# Patient Record
Sex: Male | Born: 1940 | Race: White | Hispanic: No | Marital: Married
Health system: Midwestern US, Community
[De-identification: ages and names within clinical notes are randomized; demographics above are authoritative.]

## PROBLEM LIST (undated history)

## (undated) DIAGNOSIS — E538 Deficiency of other specified B group vitamins: Secondary | ICD-10-CM

## (undated) DIAGNOSIS — Z79899 Other long term (current) drug therapy: Secondary | ICD-10-CM

## (undated) DIAGNOSIS — E119 Type 2 diabetes mellitus without complications: Principal | ICD-10-CM

## (undated) DIAGNOSIS — R42 Dizziness and giddiness: Secondary | ICD-10-CM

## (undated) DIAGNOSIS — E7800 Pure hypercholesterolemia, unspecified: Secondary | ICD-10-CM

## (undated) DIAGNOSIS — H8309 Labyrinthitis, unspecified ear: Secondary | ICD-10-CM

## (undated) DIAGNOSIS — M48062 Spinal stenosis, lumbar region with neurogenic claudication: Secondary | ICD-10-CM

---

## 2014-03-18 MED ORDER — GLIMEPIRIDE 4 MG PO TABS
4 | ORAL_TABLET | Freq: Two times a day (BID) | ORAL | Status: DC
Start: 2014-03-18 — End: 2014-07-14

## 2014-03-18 MED ORDER — MECLIZINE HCL 25 MG PO TABS
25 | ORAL_TABLET | Freq: Three times a day (TID) | ORAL | Status: AC | PRN
Start: 2014-03-18 — End: 2014-03-28

## 2014-03-18 MED ORDER — MECLIZINE HCL 25 MG PO TABS
25 | ORAL_TABLET | Freq: Three times a day (TID) | ORAL | Status: DC | PRN
Start: 2014-03-18 — End: 2014-03-18

## 2014-03-18 NOTE — Patient Instructions (Signed)
Labyrinthitis: After Your Visit  Your Care Instructions     Labyrinthitis (say "lab-uh-rin-THY-tus") is a problem deep inside your inner ear. It happens when the labyrinth gets inflamed. That's the part of your inner ear that helps control your balance.  The problem may cause vertigo. Vertigo makes you feel like you're spinning or whirling. You may feel sick to your stomach or vomit. You may lose your hearing for a while. Or you may have a ringing sound in your ears.  Most of the time, labyrinthitis goes away on its own. This often takes several weeks.  If the problem is caused by bacteria, your doctor will give you antibiotics. But most cases are caused by a virus. A virus can't be cured with antibiotics.  Your doctor may give you medicines to help control the nausea and vomiting.  Follow-up care is a key part of your treatment and safety. Be sure to make and go to all appointments, and call your doctor if you are having problems. It's also a good idea to know your test results and keep a list of the medicines you take.  How can you care for yourself at home?   Try bed rest and keeping your head still for the first few days you have vertigo. This may help the vertigo and reduce nausea and vomiting.   Return to your normal activities if vertigo lasts more than a few days. This may be hard, but it usually helps your brain adapt to the vertigo more quickly. As your brain adapts, vertigo will slowly go away.   Do what you can to prevent falls. For example, keep your home uncluttered, and use nonskid mats around your house and in your bath. Vertigo makes you more likely to fall.   Try balance exercises for vertigo if your doctor suggests it. An example is to stand with your feet together, arms at your sides. Hold this position for 30 seconds.   Do the Brandt-Daroff exercise if your doctor suggests it. It may help your brain adapt to vertigo.   Sit on the edge of your bed or sofa.   Quickly lie down on one  side.   Stay in this position until the vertigo goes away or for at least 30 seconds.   Sit up. If this causes vertigo, wait for it to stop.   Do the exercise on the other side.   Repeat these steps 10 times. Do the exercise 2 times a day until the vertigo is gone.   Take your medicines exactly as prescribed. Call your doctor if you think you are having a problem with your medicine.   If your doctor prescribed antibiotics, take them as directed. Do not stop taking them just because you feel better. You need to take the full course of antibiotics.  When should you call for help?  Call 911 anytime you think you may need emergency care. For example, call if:   You passed out (lost consciousness).   You have signs of a stroke. These may include:   Sudden numbness, paralysis, or weakness in your face, arm, or leg, especially on only one side of your body.   New problems with walking or balance.   Drooling or slurred speech.   New problems speaking or understanding simple statements, or feeling confused.   A sudden, severe headache that is different from past headaches.  Call your doctor now or seek immediate medical care if:   You have new or increased nausea   or vomiting.  Watch closely for changes in your health, and be sure to contact your doctor if:   Your vertigo gets worse.   Your vertigo has not gotten better in 2 weeks.   Where can you learn more?   Go to [email or web address] and sign in to your MyChart account. Enter U094 in the Search Health Information box to learn more about "Labyrinthitis: After Your Visit."    If you do not have an account, please click on the "Sign Up Now" link.      2006-2015 [employer], [employer]. Care instructions adapted under license by [facility]. This care instruction is for use with your licensed healthcare professional. If you have questions about a medical condition or this instruction, always ask your healthcare professional. Healthwise,  Incorporated disclaims any warranty or liability for your use of this information.  Content Version: 10.4.390249; Current as of: August 07, 2013

## 2014-03-18 NOTE — Progress Notes (Signed)
Subjective:      Patient ID: Andrew [name] is a 73 y.o. male.    HPI    Review of Systems    Objective:   Physical Exam   Constitutional: He is oriented to person, place, and time. He appears well-developed and well-nourished.   HENT:   Head: Normocephalic.   Eyes: Pupils are equal, round, and reactive to light. No scleral icterus.   Neck: Normal range of motion. Neck supple. No thyromegaly present.   Cardiovascular: Normal rate, regular rhythm, normal heart sounds and intact distal pulses.    Pulmonary/Chest: Breath sounds normal.   Neurological: He is alert and oriented to person, place, and time. He displays normal reflexes. No cranial nerve deficit. Coordination normal.       Assessment:         Plan:             Subjective:       Andrew Flakernest D Santillo is a 73 y.o. male who presents for evaluation dizziness. The symptoms started 2 months ago and are gradually worsening.  The attacks occur every 2 days and last 1 minutes. Positions that worsen symptoms: bending over, lying down and standing up. Previous workup/treatments: none. Associated ear symptoms: otalgia in the right ear which is ongoing. Associated CNS symptoms: none. Recent infections: none. Head trauma: denied.  Patient's medications, allergies, past medical, surgical, social and family histories were reviewed and updated as appropriate.    Review of Systems  Pertinent items are noted in HPI.      Objective:      BP 163/87   Pulse 67   Ht 5\' 10"  (1.778 m)   Wt 199 lb 12.8 oz (90.629 kg)   BMI 28.67 kg/m2         Physical Exam   Constitutional: He is oriented to person, place, and time. He appears well-developed and well-nourished.   HENT:   Head: Normocephalic.   Eyes: Pupils are equal, round, and reactive to light. No scleral icterus.   Neck: Normal range of motion. Neck supple. No thyromegaly present.   Cardiovascular: Normal rate, regular rhythm, normal heart sounds and intact distal pulses.    Pulmonary/Chest: Breath sounds normal.   Neurological: He  is alert and oriented to person, place, and time. He displays normal reflexes. No cranial nerve deficit. Coordination normal.       Assessment:      Acute labyrinthitis      Plan:      Meclizine per med orders.

## 2014-04-07 ENCOUNTER — Telehealth

## 2014-04-07 NOTE — Telephone Encounter (Signed)
Pt is still having dizziness after using meclizine. Is seeing the ENT the next step or something else?

## 2014-04-07 NOTE — Telephone Encounter (Signed)
Patient cd stating he saw dr Jean RosenthalJackson and she put him on meds. He is still experiencing dizziness. He would like to be referred to ENT. Please call him at [phone].

## 2014-04-08 NOTE — Telephone Encounter (Signed)
Pt advised of Dr. Edison PaceJackson's recommendation.  Pt will contact ENT.

## 2014-04-08 NOTE — Telephone Encounter (Signed)
Meds Dr Shela Commons (26 th of June)put him on not working. Do you want to switch Meds or Refer to an ENT? [phone]

## 2014-04-08 NOTE — Telephone Encounter (Signed)
See ENT, Dr Gentry Fitz in [place]

## 2014-04-13 MED ADMIN — cyanocobalamin injection 1,000 mcg: 1000 ug | INTRAMUSCULAR | @ 14:00:00 | NDC 63323004401

## 2014-04-19 ENCOUNTER — Telehealth

## 2014-04-19 NOTE — Telephone Encounter (Signed)
Saw Dr.Donald ENT and he found nothing wrong with him. He still is dizzy and wants to know next step. Please advise.

## 2014-04-20 NOTE — Telephone Encounter (Signed)
Gave pt. The info. Will need a PT order and referral  for him to go.

## 2014-04-20 NOTE — Telephone Encounter (Signed)
Please see [facility] to learn maneuvers to mobilize crystals in inner ear.  Would consider neuro consult if no better.

## 2014-04-21 NOTE — Telephone Encounter (Signed)
PT req up front for pt to pick up

## 2014-04-21 NOTE — Telephone Encounter (Signed)
Pt advised that PT referral is ready for pickup.  [facility]  Referral given to receptionist at [facility].

## 2014-05-14 MED ORDER — NEOMYCIN-POLYMYXIN-HC 3.5-10000-1 OT SOLN
Freq: Three times a day (TID) | OTIC | Status: DC
Start: 2014-05-14 — End: 2014-05-14

## 2014-05-14 MED ORDER — NEOMYCIN-POLYMYXIN-HC 3.5-10000-1 OT SOLN
Freq: Three times a day (TID) | OTIC | Status: AC
Start: 2014-05-14 — End: 2014-05-24

## 2014-05-14 MED ADMIN — cyanocobalamin injection 1,000 mcg: 1000 ug | INTRAMUSCULAR | @ 14:00:00 | NDC 63323004401

## 2014-05-14 NOTE — Telephone Encounter (Signed)
Pt was in today for his B-12 injection and wanted to speak to the Dr. About off and on R ear pain. Pt said this all started in May after swimming and he thought it was swimmers ear but the pain keeps coming back. Pt states that he went to see Dr Teresita [facility]uraMcdonnell ENT and nothing was done. Pt said he was also previously experiencing vertigo which has improved. He is still using OTC ear drops. Pt denies a fever or any other SX. Pt also said his ear is very sensitive when he has the window down in his car and air blows on it. I told him I would send a message to the Doctor and call him back later. He agreed that was fine.

## 2014-05-14 NOTE — Telephone Encounter (Signed)
Pt notified by phone.

## 2014-05-14 NOTE — Telephone Encounter (Signed)
Pt called and said that his new RX was sent to the wrong pharmacy. Please resend to [facility]. Thank You!   Status: Signed        Expand All Collapse All   Try cortisporin otic susp. If no better, needs an appointment                Beatrice Lecher, CMA at 05/14/2014 9:56 AM    Status: Signed        Expand All Collapse All   Pt was in today for his B-12 injection and wanted to speak to the Dr. About off and on R ear pain. Pt said this all started in May after swimming and he thought it was swimmers ear but the pain keeps coming back. Pt states that he went to see Dr Teresita Madura ENT and nothing was done. Pt said he was also previously experiencing vertigo which has improved. He is still using OTC ear drops. Pt denies a fever or any other SX. Pt also said his ear is very sensitive when he has the window down in his car and air blows on it. I told him I would send a message to the Doctor and call him back later. He agreed that was fine.

## 2014-05-14 NOTE — Telephone Encounter (Signed)
Try cortisporin otic susp.  If no better, needs an appointment

## 2014-06-11 NOTE — Progress Notes (Signed)
Pt received B12 injection. Pt tolerated well. Left office in stable condition.

## 2014-06-30 NOTE — Telephone Encounter (Signed)
Yes.

## 2014-06-30 NOTE — Telephone Encounter (Signed)
Pt calls today to say he stepped on a sharp object today and it just broke the skin. He wants to know if he should come in for tetanus shot since he doesn't know when he one last. Please advise

## 2014-07-01 ENCOUNTER — Encounter: Admit: 2014-07-01 | Discharge: 2014-07-01 | Payer: PRIVATE HEALTH INSURANCE

## 2014-07-01 DIAGNOSIS — Z23 Encounter for immunization: Secondary | ICD-10-CM

## 2014-07-01 NOTE — Telephone Encounter (Signed)
Pt notified.

## 2014-07-13 ENCOUNTER — Encounter

## 2014-07-14 ENCOUNTER — Encounter

## 2014-07-14 LAB — CBC WITH AUTO DIFFERENTIAL
Basophils %: 0.8 %
Basophils Absolute: 0.1 10*3/uL (ref 0.0–0.1)
Eosinophils %: 6.3 %
Eosinophils Absolute: 0.6 10*3/uL — ABNORMAL HIGH (ref 0.0–0.5)
Hematocrit: 40.3 % (ref 40–52)
Hemoglobin: 13.6 g/dl (ref 13.0–18.0)
Lymphocytes %: 21 %
Lymphocytes Absolute: 2.2 10*3/uL (ref 1.0–4.3)
MCH: 30.4 pg (ref 26–34)
MCHC: 33.8 g/dl (ref 32–36)
MCV: 89.7 fL (ref 80–98)
MPV: 9.4 fL (ref 7.4–10.4)
Monocytes %: 12.3 %
Monocytes Absolute: 1.3 10*3/uL — ABNORMAL HIGH (ref 0.0–0.8)
Neutrophils Absolute: 6.2 10*3/uL (ref 1.8–7.0)
Platelets: 159 10*3/uL (ref 140–440)
RBC: 4.5 mil/uL (ref 4.4–5.9)
RDW: 13.4 % (ref 11.5–14.5)
Segs Relative: 59.6 %
WBC: 10.4 10*3/uL (ref 3.6–10.7)

## 2014-07-14 LAB — COMPREHENSIVE METABOLIC PANEL
ALT: 42 U/L (ref 13–61)
AST: 28 U/L (ref 0–31)
Albumin,Serum: 4.1 G/dL (ref 3.4–5.0)
Albumin/Globulin Ratio: 1.7 RATIO (ref 1.0–2.5)
Alkaline Phosphatase: 88 U/L (ref 45–117)
Anion Gap: 10 mmol/L (ref 6–16)
BUN/Creatinine Ratio: 17 RATIO (ref 6.0–20.0)
BUN: 19 mG/dL (ref 7–25)
CO2: 27 mmol/L (ref 21–31)
Calcium: 9.2 mG/dL (ref 8.2–10.5)
Chloride: 101 mmol/L (ref 98–109)
Creatinine: 1.12 mG/dL (ref 0.60–1.50)
GFR African American: >60 mL/min/{1.73_m2}
GFR Non-African American: >60 mL/min/{1.73_m2}
Globulin: 2.4 G/dL (ref 2.4–4.1)
Glucose: 186 mG/dL — ABNORMAL HIGH (ref 70–100)
Potassium: 3.8 mmol/L (ref 3.5–5.0)
Sodium: 138 mmol/L (ref 135–145)
Total Bilirubin: 1.1 mG/dL — ABNORMAL HIGH (ref 0.3–1.0)
Total Protein: 6.5 G/dL (ref 6.4–8.3)

## 2014-07-14 LAB — TSH: TSH: 2.09 u[IU]/mL (ref 0.358–3.74)

## 2014-07-14 LAB — LIPID PANEL
Cholesterol, Total: 92 mG/dL — ABNORMAL LOW (ref 100–199)
HDL: 31 mG/dL — ABNORMAL LOW (ref 40–59)
LDL Calculated: 23 mG/dL (ref 0–129)
Triglycerides: 188 mG/dL — ABNORMAL HIGH (ref 40–149)

## 2014-07-14 LAB — PSA SCREENING: PSA: 0.82 nG/mL (ref 0.00–4.00)

## 2014-07-14 MED ORDER — SITAGLIPTIN PHOSPHATE 100 MG PO TABS
100 MG | ORAL_TABLET | Freq: Every day | ORAL | Status: DC
Start: 2014-07-14 — End: 2014-11-11

## 2014-07-14 MED ORDER — HYDROCORTISONE 2.5 % EX CREA
2.5 % | CUTANEOUS | Status: DC
Start: 2014-07-14 — End: 2017-07-23

## 2014-07-14 MED ORDER — TAMSULOSIN HCL 0.4 MG PO CAPS
0.4 MG | ORAL_CAPSULE | Freq: Every day | ORAL | Status: DC
Start: 2014-07-14 — End: 2014-11-11

## 2014-07-14 MED ORDER — ATORVASTATIN CALCIUM 80 MG PO TABS
80 MG | ORAL_TABLET | Freq: Every day | ORAL | Status: DC
Start: 2014-07-14 — End: 2016-07-19

## 2014-07-14 MED ORDER — GLIMEPIRIDE 4 MG PO TABS
4 MG | ORAL_TABLET | Freq: Two times a day (BID) | ORAL | Status: DC
Start: 2014-07-14 — End: 2015-07-26

## 2014-07-14 MED ORDER — METFORMIN HCL 500 MG PO TABS
500 MG | ORAL_TABLET | Freq: Three times a day (TID) | ORAL | Status: DC
Start: 2014-07-14 — End: 2014-11-11

## 2014-07-14 MED ORDER — ATENOLOL 50 MG PO TABS
50 MG | ORAL_TABLET | Freq: Every day | ORAL | Status: DC
Start: 2014-07-14 — End: 2014-11-11

## 2014-07-14 MED ORDER — LOSARTAN POTASSIUM 50 MG PO TABS
50 MG | ORAL_TABLET | Freq: Every day | ORAL | Status: DC
Start: 2014-07-14 — End: 2014-11-11

## 2014-07-14 MED ORDER — HYDROCODONE-ACETAMINOPHEN 5-325 MG PO TABS
5-325 MG | ORAL_TABLET | Freq: Four times a day (QID) | ORAL | Status: AC | PRN
Start: 2014-07-14 — End: 2014-10-12

## 2014-07-14 MED ORDER — GLUCOSE BLOOD VI STRP
Freq: Every day | Status: DC
Start: 2014-07-14 — End: 2014-08-02

## 2014-07-14 MED ORDER — HYDROCHLOROTHIAZIDE 25 MG PO TABS
25 MG | ORAL_TABLET | Freq: Every day | ORAL | Status: DC
Start: 2014-07-14 — End: 2014-11-11

## 2014-07-14 MED ADMIN — cyanocobalamin injection 1,000 mcg: 1000 ug | INTRAMUSCULAR | @ 14:00:00 | NDC 63323004401

## 2014-07-14 NOTE — Progress Notes (Signed)
SUBJECTIVE:    Andrew [name]   73 y.o.   male    Chief Complaint   Patient presents with   ??? Annual Exam        HPI    HPI: Annual wellness visit.    History reviewed. No pertinent past medical history.   Family History   Problem Relation Age of Onset   ??? Coronary Art Dis Mother    ??? Diabetes Mother      Type 2   ??? Cancer Mother      Breast   ??? Other Father      CHF      History     Social History   ??? Marital Status: Married     Spouse Name: N/A     Number of Children: N/A   ??? Years of Education: N/A     Occupational History   ??? Not on file.     Social History Main Topics   ??? Smoking status: Never Smoker    ??? Smokeless tobacco: Not on file   ??? Alcohol Use: Not on file   ??? Drug Use: Not on file   ??? Sexual Activity: Not on file     Other Topics Concern   ??? Not on file     Social History Narrative     Past Surgical History   Procedure Laterality Date   ??? Eye surgery  03/2009     laser surgery right eye   ??? Rotator cuff repair       Right   ??? Elbow surgery       right   ??? Tonsillectomy     ??? Vitrectomy  02/2011     right   ??? Colonoscopy  2013     Allergies   Allergen Reactions   ??? Pollen Extract Other (See Comments)     Sneezing, itching, watery eyes/nose, ears      Immunization History   Administered Date(s) Administered   ??? Pneumococcal 13-valent Conjugate (Prevnar13) 07/13/2013   ??? Tdap (Boostrix, Adacel) 07/01/2014   ??? Zoster 07/14/2009        Review of Systems   Constitutional: Negative for activity change, appetite change and fatigue.   HENT: Negative for congestion, ear pain, hearing loss, sinus pressure, sore throat and trouble swallowing.    Eyes: Negative for discharge, redness and visual disturbance.   Respiratory: Negative for apnea, cough, shortness of breath and wheezing.    Cardiovascular: Negative for chest pain, palpitations and leg swelling.   Gastrointestinal: Negative for nausea, vomiting, abdominal pain, diarrhea, constipation, blood in stool and abdominal distention.   Endocrine: Negative for cold  intolerance, heat intolerance, polydipsia, polyphagia and polyuria.   Genitourinary: Negative for dysuria, urgency, frequency and hematuria.   Musculoskeletal: Negative for myalgias, back pain, joint swelling and arthralgias.   Skin: Negative for color change, rash and wound.   Allergic/Immunologic: Negative.    Neurological: Negative for dizziness, tremors, light-headedness, numbness and headaches.   Hematological: Negative for adenopathy. Does not bruise/bleed easily.   Psychiatric/Behavioral: Negative for confusion, sleep disturbance and dysphoric mood. The patient is not nervous/anxious.        OBJECTIVE:  BP 169/86 mmHg   Pulse 60   Ht 5\' 11"  (1.803 m)   Wt 203 lb (92.08 kg)   BMI 28.33 kg/m2   Physical Exam   Constitutional: He is oriented to person, place, and time. He appears well-developed and well-nourished.   HENT:   Head: Normocephalic.  Right Ear: External ear normal.   Left Ear: External ear normal.   Nose: Nose normal.   Mouth/Throat: Oropharynx is clear and moist.   Eyes: Conjunctivae and EOM are normal. Pupils are equal, round, and reactive to light. No scleral icterus.   Neck: Normal range of motion. Neck supple. No JVD present. No tracheal deviation present. No thyromegaly present.   Cardiovascular: Normal rate, regular rhythm, normal heart sounds and intact distal pulses.  Exam reveals no friction rub.    No murmur heard.  Pulmonary/Chest: Effort normal and breath sounds normal. No respiratory distress. He has no wheezes. He has no rales.   Abdominal: Soft. Bowel sounds are normal. He exhibits no distension and no mass. There is no tenderness.   Musculoskeletal: Normal range of motion. He exhibits no edema or tenderness.   Lymphadenopathy:     He has no cervical adenopathy.   Neurological: He is alert and oriented to person, place, and time. He displays normal reflexes. No cranial nerve deficit. He exhibits normal muscle tone. Coordination normal.   Skin: Skin is warm and dry. No rash noted. No  pallor.   Psychiatric: He has a normal mood and affect. His behavior is normal. Judgment and thought content normal.   Nursing note and vitals reviewed.    No results found for: CMP, TSH, PSA      ASSESSMENT:    No diagnosis found.       PLAN:    No orders of the defined types were placed in this encounter.     No orders of the defined types were placed in this encounter.      No Follow-up on file.

## 2014-07-15 LAB — HEMOGLOBIN A1C: Hemoglobin A1C: 7.8 % — ABNORMAL HIGH (ref 4.2–5.6)

## 2014-07-22 NOTE — Telephone Encounter (Signed)
[facility] with reference number [id number]. Called [facility], spoke with Mathis FareAlbert the pharmacist. He states that was in reference to his metformin med. States it was cleared to ship and would ship out tomorrow.  Pt notified.

## 2014-07-30 NOTE — Telephone Encounter (Signed)
pls find out what meter he uses so that i can correctly order test strips and lancets

## 2014-07-30 NOTE — Telephone Encounter (Signed)
Pt states he uses a One Touch Ultra and tests once daily sometimes bid daily.

## 2014-07-30 NOTE — Telephone Encounter (Signed)
[facility] for lancets & strips, [facility] [place]

## 2014-08-02 MED ORDER — ONETOUCH ULTRASOFT LANCETS MISC
Freq: Two times a day (BID) | Status: DC
Start: 2014-08-02 — End: 2015-07-26

## 2014-08-02 MED ORDER — GLUCOSE BLOOD VI STRP
Freq: Two times a day (BID) | Status: DC
Start: 2014-08-02 — End: 2015-07-26

## 2014-08-02 NOTE — Telephone Encounter (Signed)
erx done

## 2014-08-05 NOTE — Telephone Encounter (Signed)
Pt states he needs a form filled out for strips and lancets. I called [facility] [place] and ask them to fax it to the nurses station.

## 2014-11-11 MED ORDER — TAMSULOSIN HCL 0.4 MG PO CAPS
0.4 MG | ORAL_CAPSULE | Freq: Every day | ORAL | Status: DC
Start: 2014-11-11 — End: 2014-11-18

## 2014-11-11 MED ORDER — SITAGLIPTIN PHOSPHATE 100 MG PO TABS
100 MG | ORAL_TABLET | Freq: Every day | ORAL | Status: DC
Start: 2014-11-11 — End: 2014-11-18

## 2014-11-11 MED ORDER — HYDROCHLOROTHIAZIDE 25 MG PO TABS
25 MG | ORAL_TABLET | Freq: Every day | ORAL | Status: DC
Start: 2014-11-11 — End: 2014-11-18

## 2014-11-11 MED ORDER — LOSARTAN POTASSIUM 50 MG PO TABS
50 MG | ORAL_TABLET | Freq: Every day | ORAL | Status: DC
Start: 2014-11-11 — End: 2014-11-18

## 2014-11-11 MED ORDER — METFORMIN HCL 500 MG PO TABS
500 MG | ORAL_TABLET | Freq: Three times a day (TID) | ORAL | Status: DC
Start: 2014-11-11 — End: 2014-11-18

## 2014-11-11 MED ORDER — ATENOLOL 50 MG PO TABS
50 MG | ORAL_TABLET | Freq: Every day | ORAL | Status: DC
Start: 2014-11-11 — End: 2014-11-18

## 2014-11-11 NOTE — Telephone Encounter (Signed)
Insurance change and needs scripts resent to [facility].  Last PE 10/15

## 2014-11-11 NOTE — Telephone Encounter (Signed)
erx

## 2014-11-18 MED ORDER — HYDROCHLOROTHIAZIDE 25 MG PO TABS
25 MG | ORAL_TABLET | Freq: Every day | ORAL | Status: DC
Start: 2014-11-18 — End: 2015-07-26

## 2014-11-18 MED ORDER — LOSARTAN POTASSIUM 50 MG PO TABS
50 MG | ORAL_TABLET | Freq: Every day | ORAL | Status: DC
Start: 2014-11-18 — End: 2015-07-26

## 2014-11-18 MED ORDER — ATENOLOL 50 MG PO TABS
50 MG | ORAL_TABLET | Freq: Every day | ORAL | Status: DC
Start: 2014-11-18 — End: 2015-07-26

## 2014-11-18 MED ORDER — TAMSULOSIN HCL 0.4 MG PO CAPS
0.4 MG | ORAL_CAPSULE | Freq: Every day | ORAL | Status: DC
Start: 2014-11-18 — End: 2015-07-26

## 2014-11-18 MED ORDER — SITAGLIPTIN PHOSPHATE 100 MG PO TABS
100 MG | ORAL_TABLET | Freq: Every day | ORAL | Status: DC
Start: 2014-11-18 — End: 2015-07-26

## 2014-11-18 MED ORDER — METFORMIN HCL 500 MG PO TABS
500 MG | ORAL_TABLET | Freq: Three times a day (TID) | ORAL | Status: DC
Start: 2014-11-18 — End: 2015-07-26

## 2014-11-18 NOTE — Telephone Encounter (Signed)
yes

## 2014-11-18 NOTE — Telephone Encounter (Signed)
Pt called sts he asked for his meds to [facility] sent [facility] last [facility]. Pt called the pharmacy today and said they never received anything.

## 2014-11-18 NOTE — Telephone Encounter (Signed)
erx

## 2014-11-18 NOTE — Telephone Encounter (Signed)
Yes please see refill enc.

## 2014-11-18 NOTE — Telephone Encounter (Signed)
Andrew [name] - can you pls load the meds in and [facility].

## 2014-11-18 NOTE — Telephone Encounter (Signed)
Please send the meds listed below to [facility] as requested by the Pt in the previous encounter . I put in refills for all meds sent 11/11/2014 to [facility] in error.

## 2015-04-08 ENCOUNTER — Encounter: Admit: 2015-04-08 | Discharge: 2015-04-08 | Payer: MEDICARE

## 2015-04-08 DIAGNOSIS — E538 Deficiency of other specified B group vitamins: Secondary | ICD-10-CM

## 2015-04-08 MED ORDER — CYANOCOBALAMIN 1000 MCG/ML IJ SOLN
1000 MCG/ML | INTRAMUSCULAR | Status: AC
Start: 2015-04-08 — End: 2015-10-06
  Administered 2015-05-06 – 2015-06-06 (×2): 1000 ug via INTRAMUSCULAR

## 2015-04-08 MED ORDER — CYANOCOBALAMIN 1000 MCG/ML IJ SOLN
1000 MCG/ML | Freq: Once | INTRAMUSCULAR | Status: AC
Start: 2015-04-08 — End: 2015-04-08
  Administered 2015-04-08: 15:00:00 1000 ug via INTRAMUSCULAR

## 2015-04-08 NOTE — Progress Notes (Signed)
Pt aware not to take aleve and asa due to possible bleeding, pt here for b-12 injection, last one was in April in [place], pt given updated med. List,  Patient tolerated injection well, pt to return next month for next injection, also cpe due after 07/15/15

## 2015-05-06 ENCOUNTER — Encounter: Admit: 2015-05-06 | Discharge: 2015-05-06 | Payer: MEDICARE

## 2015-05-06 DIAGNOSIS — E538 Deficiency of other specified B group vitamins: Secondary | ICD-10-CM

## 2015-06-06 ENCOUNTER — Encounter: Admit: 2015-06-06 | Discharge: 2015-06-06 | Payer: MEDICARE

## 2015-06-06 DIAGNOSIS — E538 Deficiency of other specified B group vitamins: Secondary | ICD-10-CM

## 2015-06-06 NOTE — Progress Notes (Signed)
Last b12 injection was 05/06/15, pt here for b 12 injection pt to return in one month, pt tolerated injection into right deltoid well

## 2015-06-06 NOTE — Progress Notes (Signed)
Pt stated he is having colonscopy by dr. Marilu Favre on 06/09/15

## 2015-07-05 ENCOUNTER — Encounter: Admit: 2015-07-05 | Discharge: 2015-07-05 | Payer: MEDICARE

## 2015-07-05 DIAGNOSIS — E538 Deficiency of other specified B group vitamins: Secondary | ICD-10-CM

## 2015-07-05 MED ORDER — CYANOCOBALAMIN 1000 MCG/ML IJ SOLN
1000 MCG/ML | Freq: Once | INTRAMUSCULAR | Status: AC
Start: 2015-07-05 — End: 2015-07-05
  Administered 2015-07-05: 14:00:00 1000 ug via INTRAMUSCULAR

## 2015-07-05 NOTE — Progress Notes (Signed)
High dose flu vaccine administered to L arm, pt tolerated well.  Allergies reviewed, denies previous reaction to vaccines.  CDC VIS given.  Vitamin B12 inj administered to R arm, per order.  Pt tolerated well.

## 2015-07-19 ENCOUNTER — Encounter

## 2015-07-19 ENCOUNTER — Ambulatory Visit: Admit: 2015-07-19 | Discharge: 2015-07-19 | Payer: MEDICARE | Attending: Family Medicine

## 2015-07-19 DIAGNOSIS — Z0000 Encounter for general adult medical examination without abnormal findings: Secondary | ICD-10-CM

## 2015-07-19 LAB — POCT MICROALBUMIN
Creatinine Ur POCT: 50
Microalb, Ur: 10

## 2015-07-19 NOTE — Progress Notes (Signed)
SUBJECTIVE:    Andrew [name]   74 y.o.   male    Chief Complaint   Patient presents with   ??? Annual Exam     colonoscopy 2016   ??? Referral - General     cardiologist Dr Carollee Massed        HPI    HPI: Annual wellness visit.    History reviewed. No pertinent past medical history.   Family History   Problem Relation Age of Onset   ??? Coronary Art Dis Mother    ??? Diabetes Mother      Type 2   ??? Cancer Mother      Breast   ??? Other Father      CHF      Social History     Social History   ??? Marital status: Married     Spouse name: N/A   ??? Number of children: N/A   ??? Years of education: N/A     Occupational History   ??? Not on file.     Social History Main Topics   ??? Smoking status: Never Smoker   ??? Smokeless tobacco: Never Used   ??? Alcohol use Not on file   ??? Drug use: Not on file   ??? Sexual activity: Not on file     Other Topics Concern   ??? Not on file     Social History Narrative     Past Surgical History   Procedure Laterality Date   ??? Eye surgery  03/2009     laser surgery right eye   ??? Rotator cuff repair       Right   ??? Elbow surgery       right   ??? Tonsillectomy     ??? Vitrectomy  02/2011     right   ??? Colonoscopy  2013     Allergies   Allergen Reactions   ??? Pollen Extract Other (See Comments)     Sneezing, itching, watery eyes/nose, ears      Immunization History   Administered Date(s) Administered   ??? Influenza Virus Vaccine 07/14/2014   ??? Influenza, High dose, IM, Preservative-free 07/05/2015   ??? Pneumococcal 13-valent Conjugate (Prevnar13) 07/13/2013   ??? Pneumococcal Polysaccharide (Pneumovax23) 07/14/2014   ??? Tdap (Boostrix, Adacel) 07/01/2014   ??? Zoster 07/14/2009        Current Outpatient Prescriptions:   ???  vitamin D3 (CHOLECALCIFEROL) 400 UNITS TABS tablet, Take 400 Units by mouth daily, Disp: , Rfl:   ???  HYDROcodone-acetaminophen (NORCO) 5-325 MG per tablet, Take 1 tablet by mouth every 6 hours as needed , Disp: , Rfl:   ???  atenolol (TENORMIN) 50 MG tablet, Take 1 tablet by mouth daily, Disp: 90 tablet, Rfl:  3  ???  hydrochlorothiazide (HYDRODIURIL) 25 MG tablet, Take 1 tablet by mouth daily, Disp: 90 tablet, Rfl: 3  ???  losartan (COZAAR) 50 MG tablet, Take 1 tablet by mouth daily, Disp: 90 tablet, Rfl: 3  ???  sitaGLIPtin (JANUVIA) 100 MG tablet, Take 1 tablet by mouth daily, Disp: 90 tablet, Rfl: 3  ???  tamsulosin (FLOMAX) 0.4 MG capsule, Take 1 capsule by mouth daily, Disp: 90 capsule, Rfl: 3  ???  metFORMIN (GLUCOPHAGE) 500 MG tablet, Take 1 tablet by mouth 3 times daily, Disp: 270 tablet, Rfl: 3  ???  glucose blood VI test strips (ONE TOUCH ULTRA TEST) strip, 1 each by In Vitro route 2 times daily As needed., Disp: 200 each, Rfl:  1  ???  ONE TOUCH ULTRASOFT LANCETS MISC, 1 each by Does not apply route 2 times daily, Disp: 200 each, Rfl: 1  ???  hydrocortisone 2.5 % cream, Apply topically 2 times daily., Disp: 60 Tube, Rfl: 4  ???  glimepiride (AMARYL) 4 MG tablet, Take 1 tablet by mouth 2 times daily (with meals), Disp: 60 tablet, Rfl: 3  ???  atorvastatin (LIPITOR) 80 MG tablet, Take 0.5 tablets by mouth daily, Disp: 90 tablet, Rfl: 4  ???  sildenafil (VIAGRA) 100 MG tablet, Take 100 mg by mouth as needed for Erectile Dysfunction, Disp: , Rfl:   ???  Glucosamine-Chondroit-Vit C-Mn (GLUCOSAMINE 1500 COMPLEX PO), Take by mouth, Disp: , Rfl:   ???  psyllium (METAMUCIL) 0.52 G capsule, Take 0.52 g by mouth daily, Disp: , Rfl:   ???  aspirin 81 MG tablet, Take 81 mg by mouth daily, Disp: , Rfl:   ???  acetaminophen (ACETAMINOPHEN EXTRA STRENGTH) 500 MG tablet, Take 500 mg by mouth every 6 hours as needed for Pain, Disp: , Rfl:   ???  CO ENZYME Q-10 PO, Take 20 mg by mouth daily , Disp: , Rfl:   ???  Cholecalciferol (VITAMIN D) 2000 UNITS CAPS capsule, Take by mouth, Disp: , Rfl:   ???  Probiotic Product (PROBIOTIC & ACIDOPHILUS EX ST PO), Take by mouth, Disp: , Rfl:   ???  Calcium Carb-Cholecalciferol (CALCIUM + D3) 600-200 MG-UNIT TABS, Take by mouth, Disp: , Rfl:   ???  Cetirizine HCl 10 MG CAPS, Take 10 mg by mouth as needed , Disp: , Rfl:   ???  Naproxen  Sodium (ALEVE) 220 MG CAPS, Take 1 tablet by mouth as needed , Disp: , Rfl:   ???  ranitidine (ZANTAC) 150 MG tablet, Take 150 mg by mouth as needed , Disp: , Rfl:   ???  Multiple Vitamins-Minerals (OCUVITE ADULT 50+ PO), Take by mouth, Disp: , Rfl:   ???  cyanocobalamin 1000 MCG/ML injection, Inject 1,000 mcg into the muscle once, Disp: , Rfl:     Review of Systems   Constitutional: Negative for activity change, appetite change and fatigue.   HENT: Negative for congestion, ear pain, hearing loss, sinus pressure, sore throat and trouble swallowing.    Eyes: Negative for discharge, redness and visual disturbance.   Respiratory: Negative for apnea, cough, shortness of breath and wheezing.    Cardiovascular: Negative for chest pain, palpitations and leg swelling.   Gastrointestinal: Negative for abdominal distention, abdominal pain, blood in stool, constipation, diarrhea, nausea and vomiting.   Endocrine: Negative for cold intolerance, heat intolerance, polydipsia, polyphagia and polyuria.   Genitourinary: Negative for dysuria, frequency, hematuria and urgency.   Musculoskeletal: Negative for arthralgias, back pain, joint swelling and myalgias.   Skin: Negative for color change, rash and wound.   Allergic/Immunologic: Negative.    Neurological: Negative for dizziness, tremors, light-headedness, numbness and headaches.   Hematological: Negative for adenopathy. Does not bruise/bleed easily.   Psychiatric/Behavioral: Negative for confusion, dysphoric mood and sleep disturbance. The patient is not nervous/anxious.        OBJECTIVE:  Visit Vitals   ??? BP 142/77   ??? Pulse 61   ??? Temp 98.2 ??F (36.8 ??C)   ??? Ht 5' 9.2" (1.758 m)   ??? Wt 198 lb (89.8 kg)   ??? BMI 29.07 kg/m2      Physical Exam   Constitutional: He is oriented to person, place, and time. He appears well-developed and well-nourished.   HENT:  Head: Normocephalic.   Right Ear: External ear normal.   Left Ear: External ear normal.   Nose: Nose normal.   Mouth/Throat:  Oropharynx is clear and moist.   Eyes: Conjunctivae and EOM are normal. Pupils are equal, round, and reactive to light. No scleral icterus.   Neck: Normal range of motion. Neck supple. No JVD present. No tracheal deviation present. No thyromegaly present.   Cardiovascular: Normal rate, regular rhythm, normal heart sounds and intact distal pulses.  Exam reveals no friction rub.    No murmur heard.  Pulmonary/Chest: Effort normal and breath sounds normal. No respiratory distress. He has no wheezes. He has no rales.   Abdominal: Soft. Bowel sounds are normal. He exhibits no distension and no mass. There is no tenderness.   Musculoskeletal: Normal range of motion. He exhibits no edema or tenderness.   Lymphadenopathy:     He has no cervical adenopathy.   Neurological: He is alert and oriented to person, place, and time. He displays normal reflexes. No cranial nerve deficit. He exhibits normal muscle tone. Coordination normal.   Skin: Skin is warm and dry. No rash noted. No pallor.   Psychiatric: He has a normal mood and affect. His behavior is normal. Judgment and thought content normal.   Nursing note and vitals reviewed.    TSH   Date Value Ref Range Status   07/14/2014 2.09 0.358 - 3.74 uIU/mL Final     Comment:     Test performed at: [facility], [address] [place], [place] [id number]     PSA   Date Value Ref Range Status   07/14/2014 0.82 0.00 - 4.00 nG/mL Final     Comment:     PSA INFO  Methodology: [employer] immunoassay.  PSA results obtained by different assay methods should not be  used interchangeably.  PSA levels should not be interpreted as absolute evidence of  disease.  PSA levels should be interpreted in conjunction with other  clinical and physical tests.  Test performed at: [facility], [address] [place], [place] [id number]           ASSESSMENT:    1. Annual physical exam    2. Encounter for long-term (current) use of medications    3. Screening for prostate cancer    4. Essential hypertension    5.  Vitamin B12 deficiency    6. Coronary artery disease involving native coronary artery of native heart without angina pectoris    7. Hypercholesterolemia    8. Type 2 diabetes mellitus without complication, without long-term current use of insulin (HCC)           PLAN:    No orders of the defined types were placed in this encounter.    Orders Placed This Encounter   Procedures   ??? Comprehensive Metabolic Panel   ??? Hemoglobin A1C   ??? Lipid Panel   ??? Psa screening   ??? TSH without Reflex   ??? POCT microalbumin   ??? HM Diabetes Eye Exam   ??? HM Diabetes Foot Exam      Return in about 1 year (around 07/18/2016).

## 2015-07-20 LAB — PSA SCREENING: PSA: 0.86 ng/mL (ref <4.000)

## 2015-07-20 LAB — HEMOGLOBIN A1C
Hemoglobin A1C: 7.4 % — ABNORMAL HIGH (ref 4.0–5.6)
eAG: 166 mg/dL

## 2015-07-20 LAB — COMPREHENSIVE METABOLIC PANEL
ALT: 40 U/L (ref 12–78)
AST: 25 U/L (ref 15–37)
Albumin,Serum: 4.1 g/dL (ref 3.4–5.0)
Alkaline Phosphatase: 92 U/L (ref 45–117)
Anion Gap: 11 NA
BUN: 18 mg/dL (ref 7–25)
CO2: 24 mmol/L (ref 21–32)
Calcium: 8.7 mg/dL (ref 8.2–10.1)
Chloride: 102 mmol/L (ref 98–109)
Creatinine: 1.03 mg/dL (ref 0.55–1.40)
EGFR IF NonAfrican American: >60 mL/min (ref >60)
Glucose: 201 mg/dL — ABNORMAL HIGH (ref 70–100)
Potassium: 4.2 mmol/L (ref 3.5–5.1)
Sodium: 137 mmol/L (ref 135–145)
Total Bilirubin: 0.7 mg/dL (ref 0.2–1.0)
Total Protein: 6.5 g/dL (ref 6.4–8.2)
eGFR African American: >60 mL/min (ref >60)

## 2015-07-20 LAB — TSH: TSH: 1.49 uU/mL (ref 0.358–3.740)

## 2015-07-20 LAB — LIPID PANEL
Chol/HDL Ratio: 3 NA
Cholesterol: 79 mg/dL (ref <200)
HDL: 29 mg/dL — ABNORMAL LOW (ref 40–59)
LDL Cholesterol: 22 mg/dL (ref <100)
Triglycerides: 141 mg/dL (ref <150)

## 2015-07-26 MED ORDER — HYDROCHLOROTHIAZIDE 25 MG PO TABS
25 MG | ORAL_TABLET | Freq: Every day | ORAL | 4 refills | Status: DC
Start: 2015-07-26 — End: 2016-03-09

## 2015-07-26 MED ORDER — GLIMEPIRIDE 4 MG PO TABS
4 MG | ORAL_TABLET | Freq: Two times a day (BID) | ORAL | 4 refills | Status: DC
Start: 2015-07-26 — End: 2016-07-19

## 2015-07-26 MED ORDER — GLUCOSE BLOOD VI STRP
Freq: Two times a day (BID) | 4 refills | Status: AC
Start: 2015-07-26 — End: 2015-10-24

## 2015-07-26 MED ORDER — HYDROCODONE-ACETAMINOPHEN 5-325 MG PO TABS
5-325 MG | ORAL_TABLET | Freq: Four times a day (QID) | ORAL | 0 refills | Status: AC | PRN
Start: 2015-07-26 — End: 2015-08-25

## 2015-07-26 MED ORDER — ONETOUCH ULTRASOFT LANCETS MISC
Freq: Two times a day (BID) | 4 refills | Status: DC
Start: 2015-07-26 — End: 2017-07-23

## 2015-07-26 MED ORDER — ATENOLOL 50 MG PO TABS
50 MG | ORAL_TABLET | Freq: Every day | ORAL | 4 refills | Status: DC
Start: 2015-07-26 — End: 2016-07-19

## 2015-07-26 MED ORDER — LOSARTAN POTASSIUM 50 MG PO TABS
50 MG | ORAL_TABLET | Freq: Every day | ORAL | 4 refills | Status: DC
Start: 2015-07-26 — End: 2016-07-19

## 2015-07-26 MED ORDER — SILDENAFIL CITRATE 100 MG PO TABS
100 | ORAL_TABLET | ORAL | 4 refills | Status: DC | PRN
Start: 2015-07-26 — End: 2017-07-23

## 2015-07-26 MED ORDER — SITAGLIPTIN PHOSPHATE 100 MG PO TABS
100 MG | ORAL_TABLET | Freq: Every day | ORAL | 4 refills | Status: DC
Start: 2015-07-26 — End: 2016-07-19

## 2015-07-26 MED ORDER — TAMSULOSIN HCL 0.4 MG PO CAPS
0.4 MG | ORAL_CAPSULE | Freq: Every day | ORAL | 4 refills | Status: DC
Start: 2015-07-26 — End: 2016-07-19

## 2015-07-26 MED ORDER — METFORMIN HCL 500 MG PO TABS
500 MG | ORAL_TABLET | Freq: Three times a day (TID) | ORAL | 4 refills | Status: DC
Start: 2015-07-26 — End: 2016-07-19

## 2015-07-26 NOTE — Telephone Encounter (Signed)
Pt requests all Rx's refilled except Atorvastatin & Hydrocort cream.  Last pe 07/19/15

## 2015-07-27 NOTE — Telephone Encounter (Signed)
Pt does not need hydrododone rx at this time. Will destroy the one that has been printed.

## 2015-08-02 NOTE — Telephone Encounter (Signed)
LMOM of pt , stating that we would printing copies of records from the past year and they will be at front desk for pick up- authorization form attached. [facility]

## 2015-08-02 NOTE — Telephone Encounter (Signed)
Pt requests hard copies of med records [place].

## 2015-08-04 NOTE — Progress Notes (Signed)
B12 injection given with no difficulty

## 2016-01-25 ENCOUNTER — Encounter

## 2016-01-25 MED ORDER — CYANOCOBALAMIN 1000 MCG/ML IJ SOLN
1000 MCG/ML | INTRAMUSCULAR | Status: AC
Start: 2016-01-25 — End: ?
  Administered 2016-01-27 – 2017-07-24 (×7): 1000 ug via INTRAMUSCULAR

## 2016-01-25 NOTE — Telephone Encounter (Signed)
Patient is coming in on Friday 01/27/16 for his b12 injection. Please put in an order

## 2016-01-26 NOTE — Telephone Encounter (Signed)
Order in.

## 2016-01-27 ENCOUNTER — Encounter: Admit: 2016-01-27 | Discharge: 2016-01-27 | Payer: MEDICARE

## 2016-01-27 DIAGNOSIS — E538 Deficiency of other specified B group vitamins: Secondary | ICD-10-CM

## 2016-02-28 ENCOUNTER — Encounter: Admit: 2016-02-28 | Discharge: 2016-02-28 | Payer: MEDICARE

## 2016-02-28 NOTE — Progress Notes (Signed)
B12 administered to R arm, per order.  Pt tolerated well, denies any previous reaction to injections.  Last dose 01/27/16.  Pt advised to wait 15 min in office to be monitored for adverse reactions.

## 2016-03-09 MED ORDER — HYDROCHLOROTHIAZIDE 25 MG PO TABS
25 MG | ORAL_TABLET | Freq: Every day | ORAL | 0 refills | Status: DC
Start: 2016-03-09 — End: 2016-07-19

## 2016-03-09 NOTE — Telephone Encounter (Signed)
Pt is requesting a refill on HCTZ 25mg . [facility] PE 07/19/15

## 2016-03-09 NOTE — Telephone Encounter (Signed)
Pt states that he is out of this medication.

## 2016-03-29 ENCOUNTER — Encounter: Admit: 2016-03-29 | Discharge: 2016-03-29 | Payer: MEDICARE

## 2016-03-29 DIAGNOSIS — E538 Deficiency of other specified B group vitamins: Secondary | ICD-10-CM

## 2016-03-29 NOTE — Progress Notes (Signed)
Vitamin B12 injection given to left arm. Pt tolerated injection well. Pt denies any reactions to previous immunizations and injections. Pt sat in front of checkout desk for 15 minutes and then released to go home.

## 2016-05-04 ENCOUNTER — Encounter: Admit: 2016-05-04 | Discharge: 2016-05-04 | Payer: MEDICARE

## 2016-05-04 DIAGNOSIS — E538 Deficiency of other specified B group vitamins: Secondary | ICD-10-CM

## 2016-06-04 ENCOUNTER — Encounter: Admit: 2016-06-04 | Discharge: 2016-06-04 | Payer: MEDICARE

## 2016-06-04 DIAGNOSIS — E538 Deficiency of other specified B group vitamins: Secondary | ICD-10-CM

## 2016-07-04 ENCOUNTER — Encounter: Admit: 2016-07-04 | Discharge: 2016-07-04 | Payer: MEDICARE

## 2016-07-04 DIAGNOSIS — E538 Deficiency of other specified B group vitamins: Secondary | ICD-10-CM

## 2016-07-04 MED ORDER — CYANOCOBALAMIN 1000 MCG/ML IJ SOLN
1000 MCG/ML | Freq: Once | INTRAMUSCULAR | Status: AC
Start: 2016-07-04 — End: 2016-07-04
  Administered 2016-07-04: 15:00:00 1000 ug via INTRAMUSCULAR

## 2016-07-04 NOTE — Progress Notes (Signed)
Pt ate, pt asking for high dose flu and vitamin b 12 injection. Pt here for flu vaccine. Allergies reviewed, denies previous reaction to vaccines. CDC Vis sheet reviewed. High dose flu vaccine administered in right deltoid arm, pt tolerated injection well. Patient advised to wait 15 minutes prior to being discharged. Vitamin b-12 injection into left deltoid, pt tolerated both injections well. escorted up front to wait 15 minutes until d/c.

## 2016-07-19 ENCOUNTER — Ambulatory Visit: Admit: 2016-07-19 | Discharge: 2016-07-19 | Payer: MEDICARE | Attending: Family Medicine

## 2016-07-19 DIAGNOSIS — Z0000 Encounter for general adult medical examination without abnormal findings: Secondary | ICD-10-CM

## 2016-07-19 LAB — POCT MICROALBUMIN
Creatinine Ur POCT: 50
Microalb, Ur: 30

## 2016-07-19 MED ORDER — ATENOLOL 50 MG PO TABS
50 | ORAL_TABLET | Freq: Every day | ORAL | 4 refills | Status: DC
Start: 2016-07-19 — End: 2017-07-23

## 2016-07-19 MED ORDER — LOSARTAN POTASSIUM 50 MG PO TABS
50 | ORAL_TABLET | Freq: Every day | ORAL | 4 refills | Status: DC
Start: 2016-07-19 — End: 2017-07-23

## 2016-07-19 MED ORDER — SITAGLIPTIN PHOSPHATE 100 MG PO TABS
100 | ORAL_TABLET | Freq: Every day | ORAL | 4 refills | Status: DC
Start: 2016-07-19 — End: 2017-07-23

## 2016-07-19 MED ORDER — TAMSULOSIN HCL 0.4 MG PO CAPS
0.4 MG | ORAL_CAPSULE | Freq: Every day | ORAL | 4 refills | Status: DC
Start: 2016-07-19 — End: 2017-07-23

## 2016-07-19 MED ORDER — ATORVASTATIN CALCIUM 80 MG PO TABS
80 | ORAL_TABLET | Freq: Every day | ORAL | 4 refills | Status: DC
Start: 2016-07-19 — End: 2017-07-23

## 2016-07-19 MED ORDER — METFORMIN HCL 500 MG PO TABS
500 | ORAL_TABLET | Freq: Three times a day (TID) | ORAL | 4 refills | Status: DC
Start: 2016-07-19 — End: 2017-03-13

## 2016-07-19 MED ORDER — HYDROCHLOROTHIAZIDE 25 MG PO TABS
25 | ORAL_TABLET | Freq: Every day | ORAL | 3 refills | Status: DC
Start: 2016-07-19 — End: 2017-07-23

## 2016-07-19 MED ORDER — GLIMEPIRIDE 4 MG PO TABS
4 | ORAL_TABLET | Freq: Two times a day (BID) | ORAL | 4 refills | Status: DC
Start: 2016-07-19 — End: 2017-07-23

## 2016-07-19 NOTE — Progress Notes (Signed)
SUBJECTIVE:    Andrew [name]   75 y.o.   male    Chief Complaint   Patient presents with   ??? Annual Exam     Fasting for labs. Right ear pain off/on.         HPI    HPI: Annual wellness visit.    Past Medical History:   Diagnosis Date   ??? Heart disease     heart attack 23 years ago   ??? Macular degeneration of left eye    ??? Type 2 diabetes mellitus without complication (HCC)       Family History   Problem Relation Age of Onset   ??? Coronary Art Dis Mother    ??? Diabetes Mother      Type 2   ??? Cancer Mother      Breast   ??? Other Father      CHF      Social History     Social History   ??? Marital status: Married     Spouse name: N/A   ??? Number of children: N/A   ??? Years of education: N/A     Occupational History   ??? Not on file.     Social History Main Topics   ??? Smoking status: Never Smoker   ??? Smokeless tobacco: Never Used   ??? Alcohol use Not on file   ??? Drug use: Unknown   ??? Sexual activity: Not on file     Other Topics Concern   ??? Not on file     Social History Narrative   ??? No narrative on file     Past Surgical History:   Procedure Laterality Date   ??? COLONOSCOPY  2013   ??? ELBOW SURGERY      right   ??? EYE SURGERY  03/2009    laser surgery right eye   ??? ROTATOR CUFF REPAIR      Right   ??? TONSILLECTOMY     ??? VITRECTOMY  02/2011    right     Allergies   Allergen Reactions   ??? Pollen Extract Other (See Comments)     Sneezing, itching, watery eyes/nose, ears      Immunization History   Administered Date(s) Administered   ??? Influenza Virus Vaccine 07/14/2014   ??? Influenza, High Dose 07/05/2015, 07/04/2016   ??? Pneumococcal 13-valent Conjugate (Prevnar13) 07/13/2013   ??? Pneumococcal Polysaccharide (Pneumovax23) 07/14/2014   ??? Tdap (Boostrix, Adacel) 07/01/2014   ??? Zoster 07/14/2009        Current Outpatient Prescriptions:   ???  tamsulosin (FLOMAX) 0.4 MG capsule, Take 1 capsule by mouth daily, Disp: 90 capsule, Rfl: 4  ???  metFORMIN (GLUCOPHAGE) 500 MG tablet, Take 1 tablet by mouth 3 times daily, Disp: 270 tablet, Rfl: 4  ???   glimepiride (AMARYL) 4 MG tablet, Take 1 tablet by mouth 2 times daily (with meals), Disp: 180 tablet, Rfl: 4  ???  atorvastatin (LIPITOR) 80 MG tablet, Take 0.5 tablets by mouth daily, Disp: 90 tablet, Rfl: 4  ???  atenolol (TENORMIN) 50 MG tablet, Take 1 tablet by mouth daily, Disp: 90 tablet, Rfl: 4  ???  hydrochlorothiazide (HYDRODIURIL) 25 MG tablet, Take 1 tablet by mouth daily, Disp: 90 tablet, Rfl: 3  ???  losartan (COZAAR) 50 MG tablet, Take 1 tablet by mouth daily, Disp: 90 tablet, Rfl: 4  ???  SITagliptin (JANUVIA) 100 MG tablet, Take 1 tablet by mouth daily, Disp: 90 tablet, Rfl: 4  ???  vitamin D3 (CHOLECALCIFEROL) 400 units TABS tablet, Take 400 Units by mouth daily, Disp: , Rfl:   ???  Omega 3 1000 MG CAPS, Take 1 tablet by mouth 2 times daily, Disp: , Rfl:   ???  ONE TOUCH ULTRASOFT LANCETS MISC, 1 each by Does not apply route 2 times daily, Disp: 200 each, Rfl: 4  ???  sildenafil (VIAGRA) 100 MG tablet, Take 1 tablet by mouth as needed for Erectile Dysfunction, Disp: 30 tablet, Rfl: 4  ???  Naproxen Sodium (ALEVE) 220 MG CAPS, Take 1 tablet by mouth as needed , Disp: , Rfl:   ???  hydrocortisone 2.5 % cream, Apply topically 2 times daily., Disp: 60 Tube, Rfl: 4  ???  ranitidine (ZANTAC) 150 MG tablet, Take 150 mg by mouth as needed , Disp: , Rfl:   ???  Multiple Vitamins-Minerals (OCUVITE ADULT 50+ PO), Take by mouth 2 times daily , Disp: , Rfl:   ???  Glucosamine-Chondroit-Vit C-Mn (GLUCOSAMINE 1500 COMPLEX PO), Take by mouth, Disp: , Rfl:   ???  cyanocobalamin 1000 MCG/ML injection, Inject 1,000 mcg into the muscle once, Disp: , Rfl:   ???  psyllium (METAMUCIL) 0.52 G capsule, Take 0.52 g by mouth daily, Disp: , Rfl:   ???  aspirin 81 MG tablet, Take 81 mg by mouth daily, Disp: , Rfl:   ???  acetaminophen (ACETAMINOPHEN EXTRA STRENGTH) 500 MG tablet, Take 500 mg by mouth every 6 hours as needed for Pain, Disp: , Rfl:   ???  CO ENZYME Q-10 PO, Take 1 tablet by mouth 2 times daily , Disp: , Rfl:   ???  Probiotic Product (PROBIOTIC &  ACIDOPHILUS EX ST PO), Take by mouth, Disp: , Rfl:   ???  Calcium Carb-Cholecalciferol (CALCIUM + D3) 600-200 MG-UNIT TABS, Take by mouth, Disp: , Rfl:   ???  Cetirizine HCl 10 MG CAPS, Take 10 mg by mouth as needed , Disp: , Rfl:     Review of Systems   Constitutional: Negative for activity change, appetite change and fatigue.   HENT: Positive for ear pain (rt.). Negative for congestion, hearing loss, sinus pressure, sore throat and trouble swallowing.    Eyes: Negative for discharge, redness and visual disturbance.   Respiratory: Negative for apnea, cough, shortness of breath and wheezing.    Cardiovascular: Negative for chest pain, palpitations and leg swelling.   Gastrointestinal: Negative for abdominal distention, abdominal pain, blood in stool, constipation, diarrhea, nausea and vomiting.   Endocrine: Negative for cold intolerance, heat intolerance, polydipsia, polyphagia and polyuria.   Genitourinary: Negative for dysuria, frequency, hematuria and urgency.   Musculoskeletal: Negative for arthralgias, back pain, joint swelling and myalgias.   Skin: Negative for color change, rash and wound.   Allergic/Immunologic: Negative.    Neurological: Negative for dizziness, tremors, light-headedness, numbness and headaches.   Hematological: Negative for adenopathy. Does not bruise/bleed easily.   Psychiatric/Behavioral: Negative for confusion, dysphoric mood and sleep disturbance. The patient is not nervous/anxious.        OBJECTIVE:  Ht 5' 11.5" (1.816 m)    Wt 195 lb (88.5 kg)    BMI 26.82 kg/m??    Physical Exam   Constitutional: He is oriented to person, place, and time. He appears well-developed and well-nourished.   HENT:   Head: Normocephalic.   Right Ear: External ear normal.   Left Ear: External ear normal.   Nose: Nose normal.   Mouth/Throat: Oropharynx is clear and moist.   Eyes: Conjunctivae and EOM are normal.  Pupils are equal, round, and reactive to light. No scleral icterus.   Neck: Normal range of motion.  Neck supple. No JVD present. No tracheal deviation present. No thyromegaly present.   Cardiovascular: Normal rate, regular rhythm, normal heart sounds and intact distal pulses.  Exam reveals no friction rub.    No murmur heard.  Pulmonary/Chest: Effort normal and breath sounds normal. No respiratory distress. He has no wheezes. He has no rales.   Abdominal: Soft. Bowel sounds are normal. He exhibits no distension and no mass. There is no tenderness.   Musculoskeletal: Normal range of motion. He exhibits no edema or tenderness.   Lymphadenopathy:     He has no cervical adenopathy.   Neurological: He is alert and oriented to person, place, and time. He displays normal reflexes. No cranial nerve deficit. He exhibits normal muscle tone. Coordination normal.   Skin: Skin is warm and dry. No rash noted. No pallor.   Psychiatric: He has a normal mood and affect. His behavior is normal. Judgment and thought content normal.   Nursing note and vitals reviewed.    TSH   Date Value Ref Range Status   07/19/2015 1.490 0.358 - 3.740 uU/mL Final     PSA   Date Value Ref Range Status   07/19/2015 0.860 <4.000 ng/mL Final         ASSESSMENT:    1. Annual physical exam    2. Type 2 diabetes mellitus without complication, without long-term current use of insulin (HCC)    3. Encounter for long-term (current) use of medications    4. Screening for prostate cancer    5. Essential hypertension    6. Coronary artery disease involving native coronary artery of native heart without angina pectoris    7. Hypercholesterolemia           PLAN:    Goals     None          Orders Placed This Encounter   Medications   ??? tamsulosin (FLOMAX) 0.4 MG capsule     Sig: Take 1 capsule by mouth daily     Dispense:  90 capsule     Refill:  4   ??? metFORMIN (GLUCOPHAGE) 500 MG tablet     Sig: Take 1 tablet by mouth 3 times daily     Dispense:  270 tablet     Refill:  4   ??? glimepiride (AMARYL) 4 MG tablet     Sig: Take 1 tablet by mouth 2 times daily (with  meals)     Dispense:  180 tablet     Refill:  4   ??? atorvastatin (LIPITOR) 80 MG tablet     Sig: Take 0.5 tablets by mouth daily     Dispense:  90 tablet     Refill:  4   ??? atenolol (TENORMIN) 50 MG tablet     Sig: Take 1 tablet by mouth daily     Dispense:  90 tablet     Refill:  4   ??? hydrochlorothiazide (HYDRODIURIL) 25 MG tablet     Sig: Take 1 tablet by mouth daily     Dispense:  90 tablet     Refill:  3   ??? losartan (COZAAR) 50 MG tablet     Sig: Take 1 tablet by mouth daily     Dispense:  90 tablet     Refill:  4   ??? SITagliptin (JANUVIA) 100 MG tablet     Sig:  Take 1 tablet by mouth daily     Dispense:  90 tablet     Refill:  4     Orders Placed This Encounter   Procedures   ??? Comprehensive Metabolic Panel   ??? Lipid Panel   ??? Psa screening   ??? TSH without Reflex   ??? POCT microalbumin      Return in about 1 year (around 07/19/2017).

## 2016-07-19 NOTE — Telephone Encounter (Signed)
[facility] called stating they received a script for atenolol 50 mg     [facility] st[facility] atenolol is on back order d/t manufacture issues     They have 25 mg or 100 mg available.    [facility] st[facility] if this ok they would like a cb

## 2016-07-19 NOTE — Telephone Encounter (Signed)
OK to do two 25mg  tabs to complete pts original rx of 25mg 

## 2016-07-20 ENCOUNTER — Encounter

## 2016-07-20 LAB — COMPREHENSIVE METABOLIC PANEL
ALT: 34 U/L (ref 12–78)
AST: 25 U/L (ref 15–37)
Albumin,Serum: 4.2 g/dL (ref 3.4–5.0)
Alkaline Phosphatase: 87 U/L (ref 45–117)
Anion Gap: 11 NA
BUN: 15 mg/dL (ref 7–25)
CO2: 26 mmol/L (ref 21–32)
Calcium: 9.1 mg/dL (ref 8.2–10.1)
Chloride: 101 mmol/L (ref 98–109)
Creatinine: 0.97 mg/dL (ref 0.55–1.40)
EGFR IF NonAfrican American: >60 mL/min (ref >60)
Glucose: 215 mg/dL — ABNORMAL HIGH (ref 70–100)
Potassium: 4 mmol/L (ref 3.5–5.1)
Sodium: 138 mmol/L (ref 135–145)
Total Bilirubin: 0.9 mg/dL (ref 0.2–1.0)
Total Protein: 7.1 g/dL (ref 6.4–8.2)
eGFR African American: >60 mL/min (ref >60)

## 2016-07-20 LAB — LIPID PANEL
Chol/HDL Ratio: 3 NA
Cholesterol: 103 mg/dL (ref <200)
HDL: 39 mg/dL — ABNORMAL LOW (ref 40–59)
LDL Cholesterol: 43 mg/dL (ref <100)
Triglycerides: 107 mg/dL (ref <150)

## 2016-07-20 LAB — TSH: TSH: 2.41 uU/mL (ref 0.358–3.740)

## 2016-07-20 LAB — PSA SCREENING: PSA: 0.898 ng/mL (ref <4.000)

## 2016-07-20 NOTE — Telephone Encounter (Signed)
Spoke with pharmacy advising message   Pharmacy understands   Nothing further needed

## 2017-03-13 NOTE — Telephone Encounter (Signed)
Pt called requesting refill  Last pe 07-19-16  No upcoming appts

## 2017-03-14 MED ORDER — METFORMIN HCL 500 MG PO TABS
500 | ORAL_TABLET | Freq: Three times a day (TID) | ORAL | 2 refills | Status: DC
Start: 2017-03-14 — End: 2017-03-15

## 2017-03-15 MED ORDER — METFORMIN HCL ER 500 MG PO TB24
500 MG | ORAL_TABLET | Freq: Two times a day (BID) | ORAL | 1 refills | Status: DC
Start: 2017-03-15 — End: 2017-07-23

## 2017-03-15 NOTE — Telephone Encounter (Signed)
Pt states he had been taking as directed by Dr Crissie ReeseSurso to take Metformin 500mg  ER 1 am, 2 pm- this [place] and [place] [facility] [place] [facility] [place] to change dose to 1 tab am, and 1 tab pm , and has been doing that ever since. Pt requested correction to his chart and to send corrected rx to local [facility]

## 2017-03-15 NOTE — Telephone Encounter (Signed)
I called Brain and he states he and the pt noticed that the pt rx for metFORMIN (GLUCOPHAGE) 500 MG tablet is no longer ER, and would like to know if this change is correct, or do we need to resend rx.     When checking [facility] rx listed is MetFORMIN HCl ER 500 MG Tablet Extended Release 24 Hour. Please clarify and advise.

## 2017-03-15 NOTE — Telephone Encounter (Signed)
erx done  Due for physical 10/28 or after

## 2017-03-15 NOTE — Telephone Encounter (Signed)
Arlys JohnBrian from [facility] called states he has a question about pt rx metFORMIN (GLUCOPHAGE) 500 MG tablet. No further info.

## 2017-03-15 NOTE — Telephone Encounter (Signed)
pls call pt to see if he has been taking metformin 500mg  ER and how many per day he is taking

## 2017-03-18 NOTE — Telephone Encounter (Signed)
Done.

## 2017-06-20 ENCOUNTER — Encounter

## 2017-06-20 ENCOUNTER — Telehealth

## 2017-06-20 NOTE — Telephone Encounter (Signed)
I do not see any B12 levels in his chart. Who ordered this?

## 2017-06-20 NOTE — Telephone Encounter (Signed)
A good idea to check level.

## 2017-06-20 NOTE — Telephone Encounter (Signed)
Patient coming in this afternoon for B12 shot order needed.

## 2017-06-20 NOTE — Telephone Encounter (Signed)
Dr Crissie Reese - pls advise on B12 inj, last one 06/2016, no recent B12 levels.

## 2017-06-20 NOTE — Telephone Encounter (Signed)
Will inform pt when he gets here

## 2017-06-20 NOTE — Telephone Encounter (Signed)
Please let him know he will need B12 level today and based on results we may restart B12 inj

## 2017-06-20 NOTE — Telephone Encounter (Signed)
Patient gets B12 shots here he called this morning and scheduled it for this afternoon here at our office in his past apts he has had all B12 shots here.  Pt needs an Order placed for the B12 shot

## 2017-06-20 NOTE — Telephone Encounter (Signed)
His last one was 07/10/2016 almost a year ago just seen this.  He has up coming pe 10/302018 with Dr. Crissie ReeseSurso

## 2017-06-21 LAB — VITAMIN B12: Vitamin B-12: 250 pg/mL (ref 239–931)

## 2017-06-24 ENCOUNTER — Encounter: Admit: 2017-06-24 | Discharge: 2017-06-24 | Payer: MEDICARE

## 2017-06-24 DIAGNOSIS — E538 Deficiency of other specified B group vitamins: Secondary | ICD-10-CM

## 2017-06-24 NOTE — Progress Notes (Signed)
Pt ate today, here for vitamin b 12 injection, last one was 12/23/16. Pt to sch monthly. Allergies reviewed.  Patient denies any reactions to previous injections.  Injection administered into left deltoid, pt tolerated injection well. Pt. Advised to wait 15 minutes after injection prior to discharge.

## 2017-07-23 ENCOUNTER — Encounter

## 2017-07-23 ENCOUNTER — Ambulatory Visit: Admit: 2017-07-23 | Discharge: 2017-07-23 | Payer: MEDICARE | Attending: Family Medicine

## 2017-07-23 DIAGNOSIS — Z0000 Encounter for general adult medical examination without abnormal findings: Secondary | ICD-10-CM

## 2017-07-23 LAB — POCT MICROALBUMIN
Creatinine Ur POCT: 200
Microalb, Ur: 30
Microalbumin Creatinine Ratio: <30

## 2017-07-23 MED ORDER — ATENOLOL 50 MG PO TABS
50 | ORAL_TABLET | Freq: Every day | ORAL | 4 refills | Status: AC
Start: 2017-07-23 — End: 2021-04-20

## 2017-07-23 MED ORDER — TAMSULOSIN HCL 0.4 MG PO CAPS
0.4 | ORAL_CAPSULE | Freq: Every day | ORAL | 4 refills | Status: DC
Start: 2017-07-23 — End: 2021-04-05

## 2017-07-23 MED ORDER — SILDENAFIL CITRATE 100 MG PO TABS
100 | ORAL_TABLET | ORAL | 4 refills | Status: AC | PRN
Start: 2017-07-23 — End: 2017-10-21

## 2017-07-23 MED ORDER — HYDROCORTISONE 2.5 % EX CREA
2.5 | CUTANEOUS | 4 refills | Status: DC
Start: 2017-07-23 — End: 2021-04-05

## 2017-07-23 MED ORDER — GLIMEPIRIDE 4 MG PO TABS
4 | ORAL_TABLET | Freq: Two times a day (BID) | ORAL | 4 refills | Status: DC
Start: 2017-07-23 — End: 2021-04-05

## 2017-07-23 MED ORDER — METFORMIN HCL ER 500 MG PO TB24
500 | ORAL_TABLET | Freq: Two times a day (BID) | ORAL | 1 refills | Status: AC
Start: 2017-07-23 — End: ?

## 2017-07-23 MED ORDER — ATORVASTATIN CALCIUM 80 MG PO TABS
80 | ORAL_TABLET | Freq: Every day | ORAL | 4 refills | Status: DC
Start: 2017-07-23 — End: 2017-12-25

## 2017-07-23 MED ORDER — SITAGLIPTIN PHOSPHATE 100 MG PO TABS
100 | ORAL_TABLET | Freq: Every day | ORAL | 4 refills | Status: AC
Start: 2017-07-23 — End: 2017-10-21

## 2017-07-23 MED ORDER — HYDROCHLOROTHIAZIDE 25 MG PO TABS
25 | ORAL_TABLET | Freq: Every day | ORAL | 3 refills | Status: DC
Start: 2017-07-23 — End: 2021-04-05

## 2017-07-23 MED ORDER — ONETOUCH ULTRASOFT LANCETS MISC
Freq: Two times a day (BID) | 4 refills | Status: AC
Start: 2017-07-23 — End: 2017-10-21

## 2017-07-23 MED ORDER — LOSARTAN POTASSIUM 50 MG PO TABS
50 | ORAL_TABLET | Freq: Every day | ORAL | 4 refills | Status: AC
Start: 2017-07-23 — End: 2021-04-20

## 2017-07-23 NOTE — Patient Instructions (Signed)
Personalized Preventive Plan for Andrew [name] - 07/23/2017  Medicare offers a range of preventive health benefits. Some of the tests and screenings are paid in full while other may be subject to a deductible, co-insurance, and/or copay.    Some of these benefits include a comprehensive review of your medical history including lifestyle, illnesses that may run in your family, and various assessments and screenings as appropriate.    After reviewing your medical record and screening and assessments performed today your provider may have ordered immunizations, labs, imaging, and/or referrals for you.  A list of these orders (if applicable) as well as your Preventive Care list are included within your After Visit Summary for your review.    Other Preventive Recommendations:     A preventive eye exam performed by an eye specialist is recommended every 1-2 years to screen for glaucoma; cataracts, macular degeneration, and other eye disorders.   A preventive dental visit is recommended every 6 months.   Try to get at least 150 minutes of exercise per week or 10,000 steps per day on a pedometer .   Order or download the FREE "Exercise & Physical Activity: Your Everyday Guide" from [employer]. Call [phone] or search [employer] online.   You need 1200-1500 mg of calcium and 1000-2000 IU of vitamin D per day. It is possible to meet your calcium requirement with diet alone, but a vitamin D supplement is usually necessary to meet this goal.   When exposed to the sun, use a sunscreen that protects against both UVA and UVB radiation with an SPF of 30 or greater. Reapply every 2 to 3 hours or after sweating, drying off with a towel, or swimming.   Always wear a seat belt when traveling in a car. Always wear a helmet when riding a bicycle or motorcycle.

## 2017-07-23 NOTE — Progress Notes (Signed)
Medicare Annual Wellness Visit  Name: Andrew [name] Date: 07/23/2017   MRN: [id number] Sex: Male   Age: 76 y.o. Ethnicity: Non-Hispanic/Non Latino   DOB: 08-21-1941 Race: Andrew [name] is here for Annual Exam (check area on face and back where skin cancer was removed ) and Referral - General (cardiology-Dermatology-geriatric physician)    Screenings for behavioral, psychosocial and functional/safety risks, and cognitive dysfunction are all negative except as indicated below. These results, as well as other patient data from the Health Risk Assessment form, are documented in Flowsheets linked to this Encounter.    Allergies   Allergen Reactions   . Pollen Extract Other (See Comments)     Sneezing, itching, watery eyes/nose, ears   . Seasonal Other (See Comments)     Sneezing, itching, watery eyes/nose, ears       Prior to Visit Medications    Medication Sig Taking? Authorizing Provider   vitamin B-6 (PYRIDOXINE) 100 MG tablet Take 100 mg by mouth daily Yes Historical Provider, MD   hydrocortisone 2.5 % cream Apply topically 2 times daily. Yes Duncan Dull, MD   tamsulosin [facility]) 0.4 MG capsule Take 1 capsule by mouth daily Yes Duncan Dull, MD   metFORMIN (GLUCOPHAGE XR) 500 MG extended release tablet Take 1 tablet by mouth 2 times daily Yes Duncan Dull, MD   losartan (COZAAR) 50 MG tablet Take 1 tablet by mouth daily Yes Duncan Dull, MD   atenolol (TENORMIN) 50 MG tablet Take 1 tablet by mouth daily Yes Duncan Dull, MD   atorvastatin (LIPITOR) 80 MG tablet Take 0.5 tablets by mouth daily Yes Duncan Dull, MD   glimepiride (AMARYL) 4 MG tablet Take 1 tablet by mouth 2 times daily (with meals) Yes Duncan Dull, MD   hydrochlorothiazide (HYDRODIURIL) 25 MG tablet Take 1 tablet by mouth daily Yes Duncan Dull, MD   sildenafil (VIAGRA) 100 MG tablet Take 1 tablet by mouth as needed for Erectile Dysfunction Yes Duncan Dull, MD   SITagliptin  (JANUVIA) 100 MG tablet Take 1 tablet by mouth daily Yes Duncan Dull, MD   ONE TOUCH ULTRASOFT LANCETS MISC 1 each by Does not apply route 2 times daily Yes Duncan Dull, MD   Cranberry 180 MG CAPS Take 1 tablet by mouth Takes 4200 mg daily Yes Historical Provider, MD   FLUZONE HIGH-DOSE 0.5 ML SUSY injection TO BE ADMINISTERED BY PHARMACIST FOR IMMUNIZATION Yes Historical Provider, MD   Omega 3 1000 MG CAPS Take 1 tablet by mouth 2 times daily Yes Historical Provider, MD   Naproxen Sodium (ALEVE) 220 MG CAPS Take 1 tablet by mouth as needed  Yes Historical Provider, MD   ranitidine (ZANTAC) 150 MG tablet Take 150 mg by mouth as needed  Yes Historical Provider, MD   Multiple Vitamins-Minerals (OCUVITE ADULT 50+ PO) Take by mouth 2 times daily  Yes Historical Provider, MD   psyllium (METAMUCIL) 0.52 G capsule Take 0.52 g by mouth daily Yes Historical Provider, MD   aspirin 81 MG tablet Take 81 mg by mouth daily Yes Historical Provider, MD   acetaminophen (ACETAMINOPHEN EXTRA STRENGTH) 500 MG tablet Take 500 mg by mouth every 6 hours as needed for Pain Yes Historical Provider, MD   CO ENZYME Q-10 PO Take 1 tablet by mouth 2 times daily  Yes Historical Provider, MD   Probiotic Product (PROBIOTIC & ACIDOPHILUS EX ST PO) Take by  mouth Yes Historical Provider, MD   Calcium Carb-Cholecalciferol (CALCIUM + D3) 600-200 MG-UNIT TABS Take by mouth Yes Historical Provider, MD       Past Medical History:   Diagnosis Date   . Heart disease     heart attack 23 years ago   . Macular degeneration of left eye    . Type 2 diabetes mellitus without complication [facility])      Past Surgical History:   Procedure Laterality Date   . COLONOSCOPY  2013   . ELBOW SURGERY      right   . EYE SURGERY  03/2009    laser surgery right eye   . ROTATOR CUFF REPAIR      Right   . TONSILLECTOMY     . VITRECTOMY  02/2011    right       Family History   Problem Relation Age of Onset   . Coronary Art Dis Mother    . Diabetes Mother         Type  2   . Cancer Mother         Breast   . Other Father         CHF       CareTeam (Including outside providers/suppliers regularly involved in providing care):   Patient Care Team:  Duncan Dull, MD as PCP - General (Family Medicine)    Wt Readings from Last 3 Encounters:   07/23/17 169 lb (76.7 kg)   07/19/16 195 lb (88.5 kg)   03/29/16 194 lb 9.6 oz (88.3 kg)     Vitals:    07/23/17 1115   BP: 138/70   Pulse: 64   Temp: 98.1 F (36.7 C)   Weight: 169 lb (76.7 kg)   Height: 5' 9.1" (1.755 m)     Body mass index is 24.88 kg/m.    General Appearance: alert and oriented to person, place and time, well developed and well- nourished, in no acute distress  Skin: warm and dry, no rash or erythema  Head: normocephalic and atraumatic  Eyes: pupils equal, round, and reactive to light, extraocular eye movements intact, conjunctivae normal  ENT: tympanic membrane, external ear and ear canal normal bilaterally, nose without deformity, nasal mucosa and turbinates normal without polyps  Neck: supple and non-tender without mass, no thyromegaly or thyroid nodules, no cervical lymphadenopathy  Pulmonary/Chest: clear to auscultation bilaterally- no wheezes, rales or rhonchi, normal air movement, no respiratory distress  Cardiovascular: normal rate, regular rhythm, normal S1 and S2, no murmurs, rubs, clicks, or gallops, distal pulses intact, no carotid bruits  Abdomen: soft, non-tender, non-distended, normal bowel sounds, no masses or organomegaly  Extremities: no cyanosis, clubbing or edema  Musculoskeletal: normal range of motion, no joint swelling, deformity or tenderness  Neurologic: reflexes normal and symmetric, no cranial nerve deficit, gait, coordination and speech normal    Patient's complete Health Risk Assessment and screening values have been reviewed and are found in Flowsheets. The following problems were reviewed today and where indicated follow up appointments were made and/or referrals ordered.    Positive Risk  Factor Screenings with Interventions:     No Positive Risk Factors identified today.    Personalized Preventive Plan   Current Health Maintenance Status  Immunization History   Administered Date(s) Administered   . Influenza Virus Vaccine 07/14/2014   . Influenza, High Dose (Fluzone 65 yrs and older) 07/05/2015, 07/04/2016, 06/07/2017   . Pneumococcal 13-valent Conjugate (Prevnar13) 07/13/2013   . Pneumococcal  Polysaccharide (Pneumovax23) 07/14/2014   . Tdap (Boostrix, Adacel) 07/01/2014   . Zoster Live (Zostavax) 07/14/2009   . Zoster Subunit (Shingrix) 06/24/2017        Health Maintenance   Topic Date Due   . Shingles Vaccine (2 of 2 - 2 Dose Series) 12/23/2017   . DTaP/Tdap/Td vaccine (2 - Td) 07/01/2024   . Flu vaccine  Completed   . Pneumococcal low/med risk  Completed     Recommendations for Preventive Services Due: see orders and patient instructions/AVS.  .  Recommended screening schedule for the next 5-10 years is provided to the patient in written form: see Patient Instructions/AVS.

## 2017-07-24 ENCOUNTER — Encounter: Admit: 2017-07-24 | Discharge: 2017-07-24 | Payer: MEDICARE

## 2017-07-24 ENCOUNTER — Encounter

## 2017-07-24 LAB — CBC WITH AUTO DIFFERENTIAL
Absolute Baso #: 0.1 10*3/uL (ref 0.0–0.2)
Absolute Eos #: 0.3 10*3/uL (ref 0.0–0.5)
Absolute Lymph #: 2.2 10*3/uL (ref 1.0–4.3)
Absolute Mono #: 1.1 10*3/uL — ABNORMAL HIGH (ref 0.0–0.8)
Absolute Neut #: 6.4 10*3/uL (ref 1.8–7.0)
Basophils: 0.6 % (ref 0.0–2.0)
Eosinophils: 2.9 % (ref 1.0–6.0)
Granulocytes %: 63.4 % (ref 40.0–80.0)
Hematocrit: 42 % (ref 40.0–52.0)
Hemoglobin: 14.4 g/dL (ref 13.0–18.0)
Lymphocyte %: 22.1 % (ref 20.0–40.0)
MCH: 31 pg (ref 26.0–34.0)
MCHC: 34.2 % (ref 32.0–36.0)
MCV: 90.6 fL (ref 80.0–98.0)
MPV: 9.4 fL (ref 7.4–10.4)
Monocytes: 11 % — ABNORMAL HIGH (ref 2.0–10.0)
Platelets: 186 10*3/uL (ref 140–440)
RBC: 4.64 10*6/uL (ref 4.40–5.90)
RDW: 13.4 % (ref 11.5–14.5)
WBC: 10.1 10*3/uL (ref 3.6–10.7)

## 2017-07-24 LAB — LIPID PANEL
Chol/HDL Ratio: 4 NA
Cholesterol: 96 mg/dL (ref <200)
HDL: 27 mg/dL — ABNORMAL LOW (ref 40–60)
LDL Cholesterol: 38 mg/dL (ref <100)
Triglycerides: 155 mg/dL — AB (ref <150)

## 2017-07-24 LAB — TSH: TSH: 2.845 u[IU]/mL (ref 0.465–4.680)

## 2017-07-24 LAB — COMPREHENSIVE METABOLIC PANEL
ALT: 41 U/L (ref 13–69)
AST: 24 U/L (ref 15–46)
Albumin,Serum: 4.4 g/dL (ref 3.5–5.0)
Alkaline Phosphatase: 81 U/L (ref 38–126)
Anion Gap: 12 NA
BUN: 18 mg/dL (ref 7–20)
CO2: 27 mmol/L (ref 22–30)
Calcium: 9.7 mg/dL (ref 8.4–10.4)
Chloride: 95 mmol/L — ABNORMAL LOW (ref 98–107)
Creatinine: 0.84 mg/dL (ref 0.52–1.25)
EGFR IF NonAfrican American: >60 mL/min (ref >60)
Glucose: 190 mg/dL — ABNORMAL HIGH (ref 70–100)
Potassium: 4.4 mmol/L (ref 3.5–5.1)
Sodium: 134 mmol/L — ABNORMAL LOW (ref 137–145)
Total Bilirubin: 0.8 mg/dL (ref 0.2–1.3)
Total Protein: 6.6 g/dL (ref 6.3–8.2)
eGFR African American: >60 mL/min (ref >60)

## 2017-07-24 LAB — PSA SCREENING: PSA: 1.006 ng/mL (ref <4.000)

## 2017-07-24 LAB — TESTOSTERONE: Testosterone: 411 ng/dL (ref 72–623)

## 2017-07-24 LAB — HEMOGLOBIN A1C
Hemoglobin A1C: 7.9 % — ABNORMAL HIGH (ref 4.0–5.7)
eAG: 180 mg/dL

## 2017-07-24 NOTE — Progress Notes (Signed)
Pt here for B12 injection.  He declines a blood pressure check today.  Meds and allergies reviewed.  Pt identified by stating name and date of birth.  B12 administered to right deltoid. Pt tolerated well.  Pt observed in exam room and left office without complaint.

## 2017-07-31 LAB — HM DIABETES EYE EXAM: Diabetic Retinopathy: NEGATIVE

## 2017-12-25 MED ORDER — ATORVASTATIN CALCIUM 80 MG PO TABS
80 MG | ORAL_TABLET | Freq: Every day | ORAL | 0 refills | Status: AC
Start: 2017-12-25 — End: ?

## 2017-12-25 NOTE — Telephone Encounter (Signed)
AWV 07/23/17. [place] in [place] and needs [facility] [place]/phone [phone] [place].

## 2018-01-22 NOTE — Telephone Encounter (Signed)
Pt called, wanting to know procedure for transferring records out.   Pt's wife will pick up forms blank forms at front desk today, per pt.

## 2020-10-11 IMAGING — MR MRI BRAIN WITHOUT CONTRAST
4 of 9 series · 21 of 48 positions shown · IV contrast (gadolinium)
Comparison: None.

MRI BRAIN WITHOUT CONTRAST, 10/11/2020 [DATE]: 
CLINICAL INDICATION: Numbness left side of face.
TECHNIQUE: Multiplanar, multiecho position MR images of the brain were performed 
without intravenous gadolinium enhancement.

[Series 601: SWI · axial · 3.0mm · 0.50mm/px · z∈[-84,+72]mm · 8 of 105 slices shown (1 of 2)]
[im 1/105]
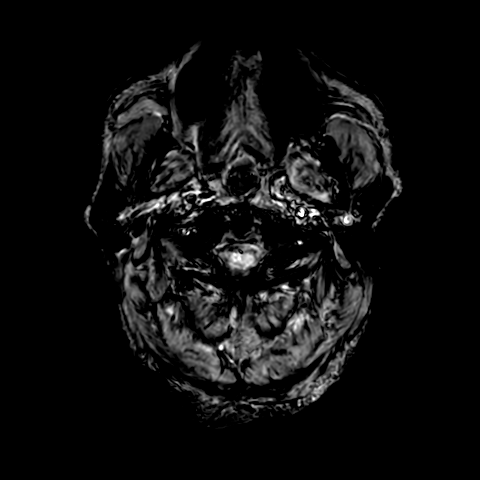
[im 17/105]
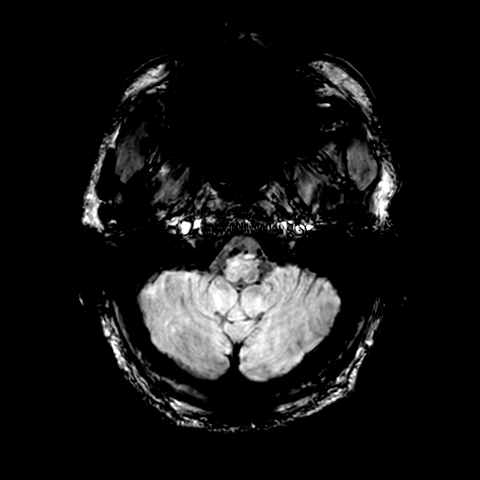
[im 33/105]
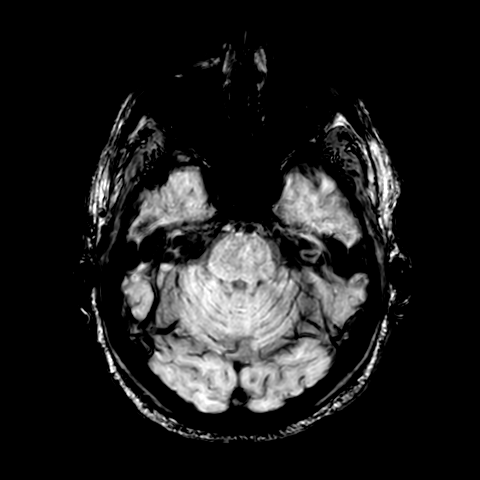
[im 49/105]
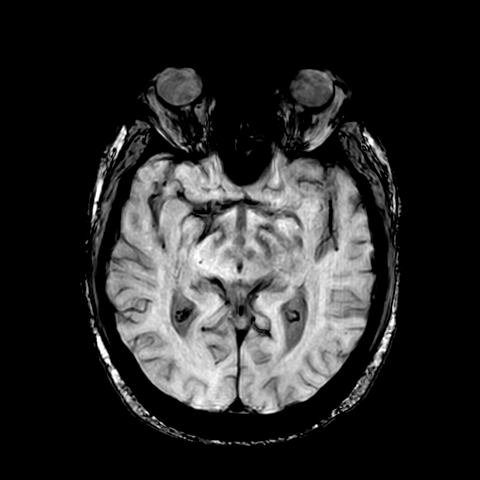
[im 57/105]
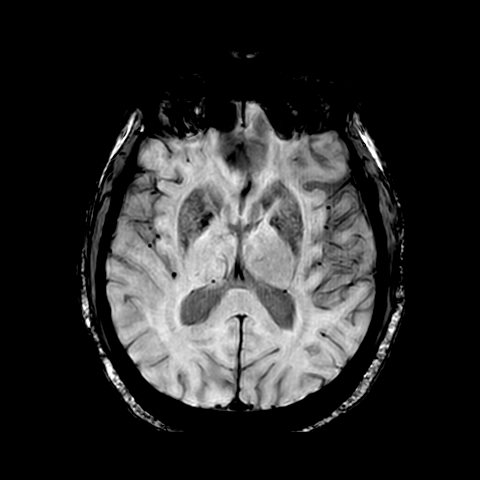
[im 73/105]
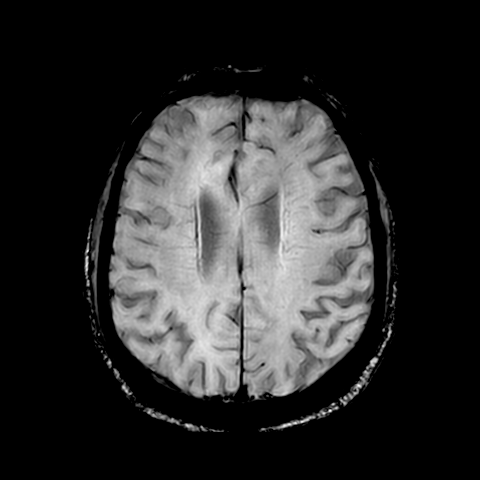
[im 89/105]
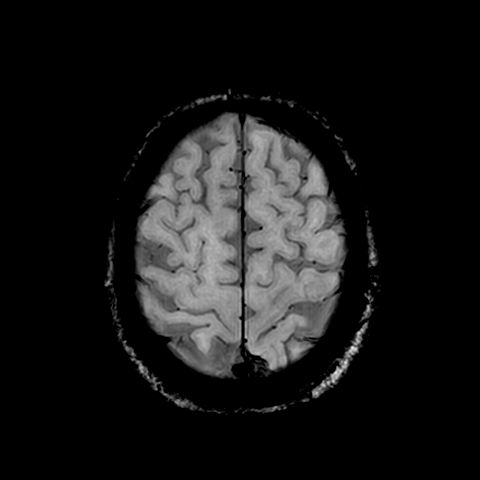
[im 105/105]
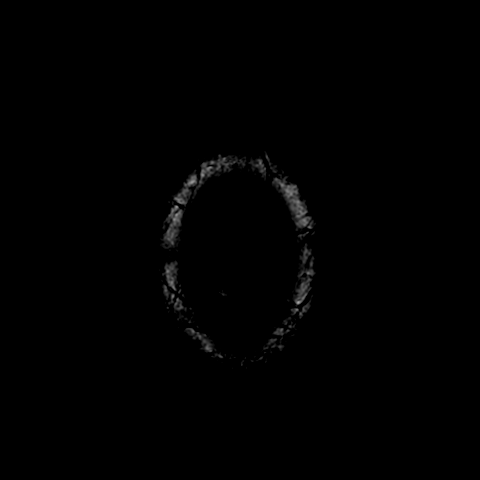

[Series 602: SWI · axial · 10.0mm · 0.48mm/px · z∈[-81,+59]mm · 4 of 79 slices shown (2 of 2)]
[im 1/79]
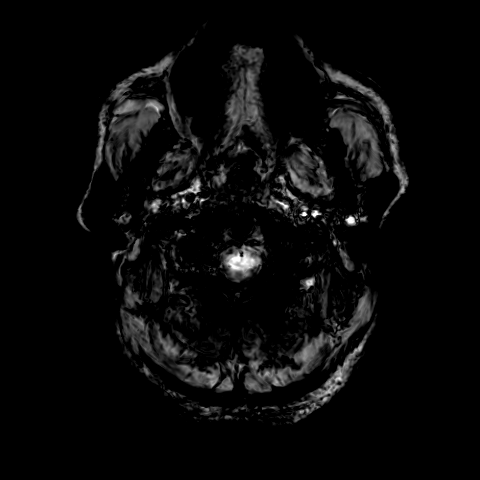
[im 8/79]
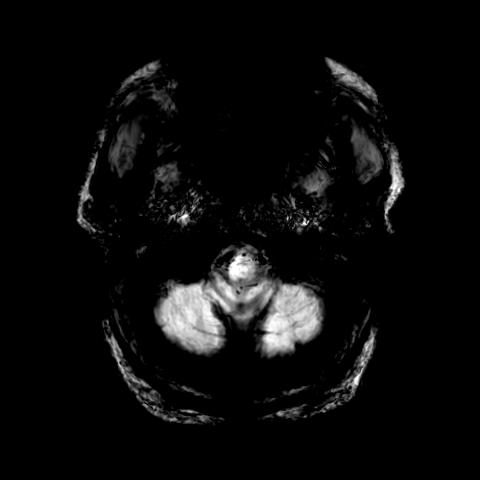
[im 40/79]
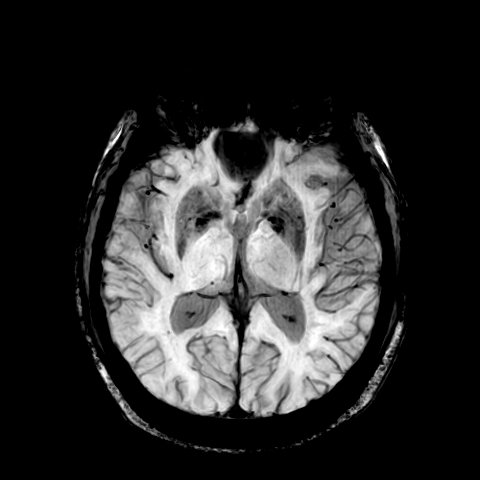
[im 71/79]
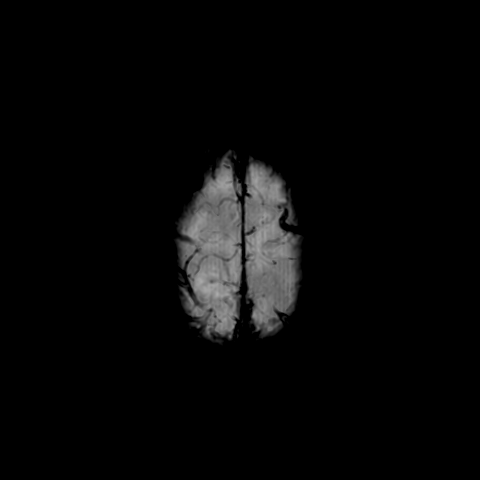

[Series 701: T2 · axial · 5.0mm · 0.43mm/px · z∈[-84,+72]mm · 4 of 27 slices shown (1 of 2)]
[im 1/27]
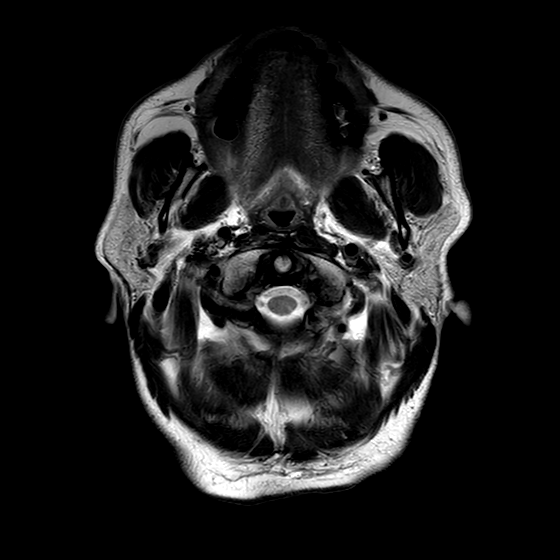
[im 9/27]
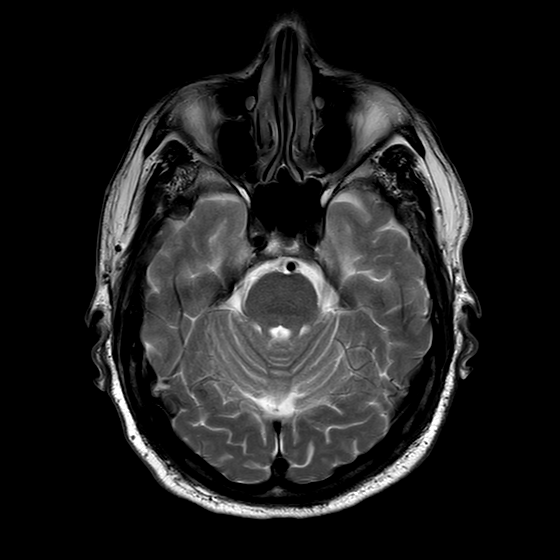
[im 18/27]
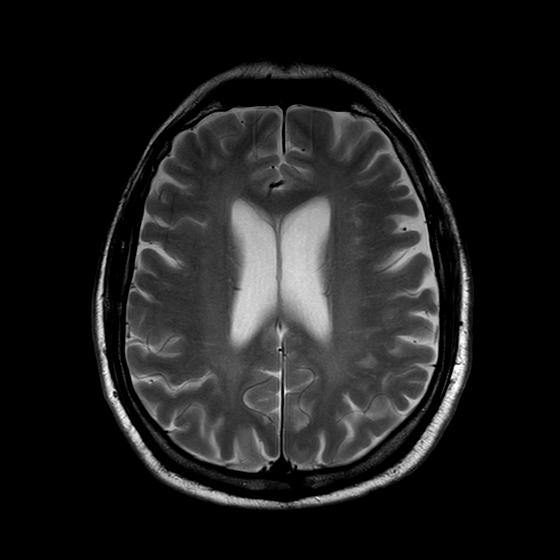
[im 27/27]
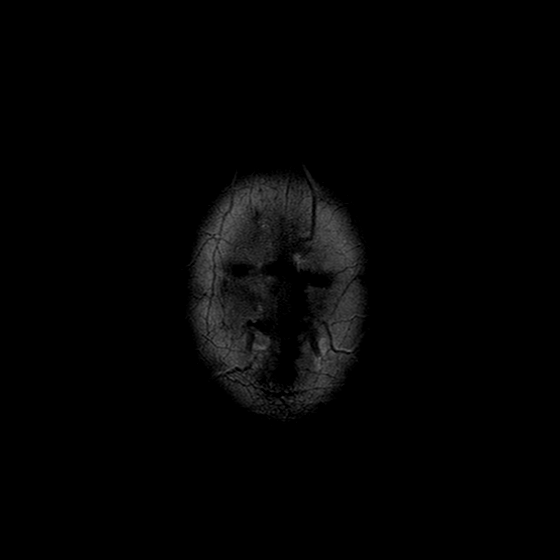

[Series 801: T2 · coronal · 4.0mm · 0.47mm/px · 5 of 36 slices shown (2 of 2)]
[im 1/36]
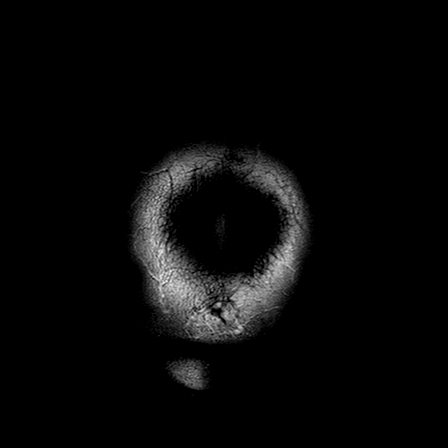
[im 9/36]
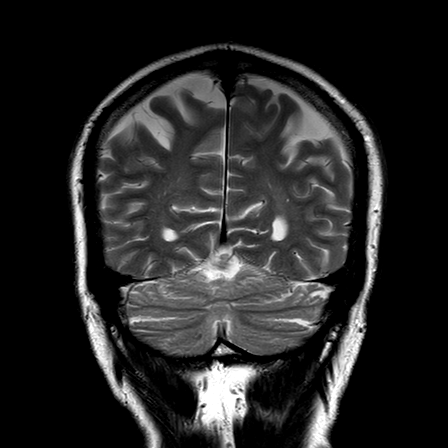
[im 18/36]
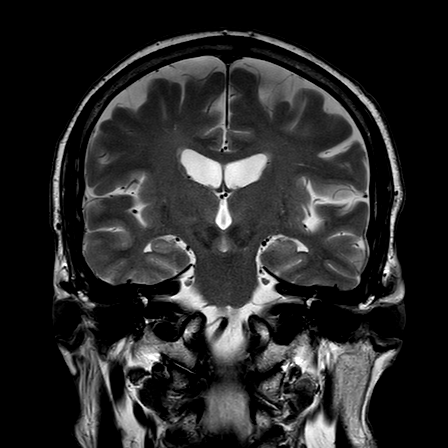
[im 27/36]
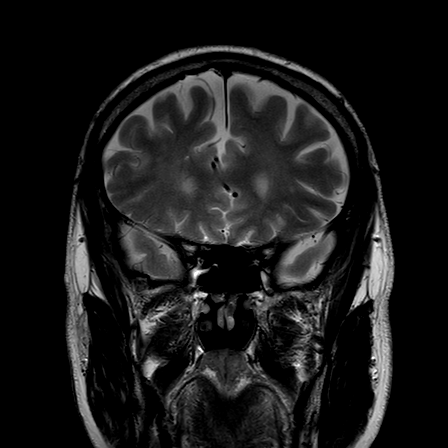
[im 36/36]
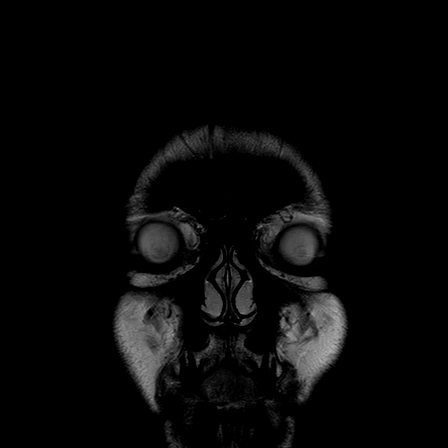

[21 of 48 positions shown; findings below may reference images not displayed]

FINDINGS: EXTRAAXIAL SPACE: Ventricles appear age appropriate and maintain midline 
position. No collections, mass lesion or significant signal abnormality. Signal 
voids of basilar and internal carotid arteries maintained. 
CEREBRUM: Superficial and deep cortical structures are well formed. No focal 
signal abnormality or mass effect present. White matter pathways including 
corpus callosum are intact. No restricted diffusion identified.  Punctate area 
of increased signal intensity within the inferior aspect of the right basal 
ganglia on the diffusion and FLAIR images but is of intermediate signal 
intensity on the ADC map. Moderate increased signal intensity in the 
periventricular white matter on the FLAIR images without corresponding abnormal 
signal intensity on the diffusion or susceptibility weighted images. 
CEREBELLUM: Cerebellar hemispheres and vermis are well formed without mass 
lesion or signal abnormality. Basal cisterns are maintained. 
BRAINSTEM: Midbrain, pons, and medulla are well formed without mass or signal 
abnormality. 
SKULL/EXTRACRANIAL STRUCTURES: Bilateral paranasal sinuses, mastoid air cells, 
and middle ear cavity without significant disease.  Pituitary fossa and orbits 
without definite mass.  Calvarium, visualized skull base and remainder of 
visualized upper neck appear unremarkable.
IMPRESSION: Nonhemorrhagic subacute lacunar infarct inferior right basal ganglia. 
Moderate small vessel disease. 
No intracranial hemorrhage or focal mass.

## 2021-01-09 IMAGING — DX LUMBAR SPINE 2 VIEW
1 series · 2 of 2 positions shown · non-contrast
Comparison: None

LUMBAR SPINE 2 VIEW, 01/09/2021 [DATE]: 
CLINICAL INDICATION:  Bilateral sciatica for 3 to 4 weeks, no injury
TECHNIQUE: 2 views of the Lumbar spine were obtained.

[Series 1: AP · U · 0.14mm/px · 2 of 2 slices shown]
[im 1/2]
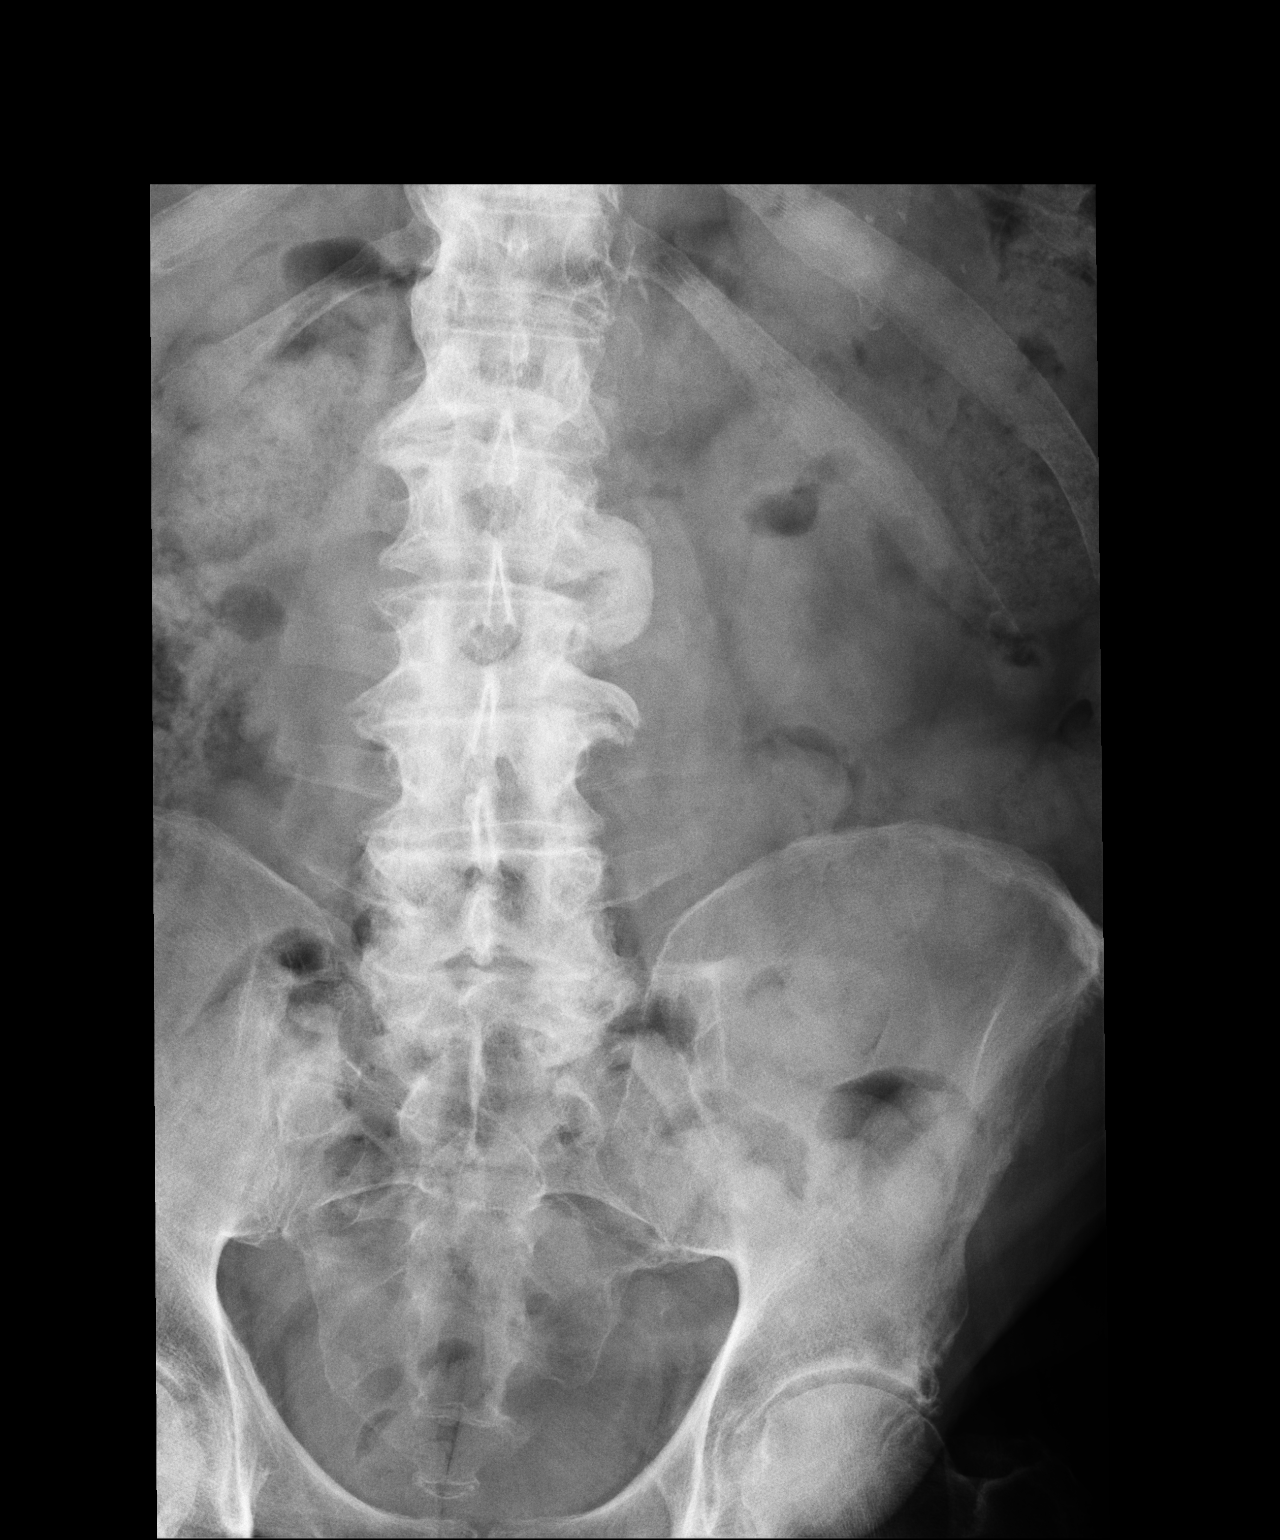
[im 2/2]
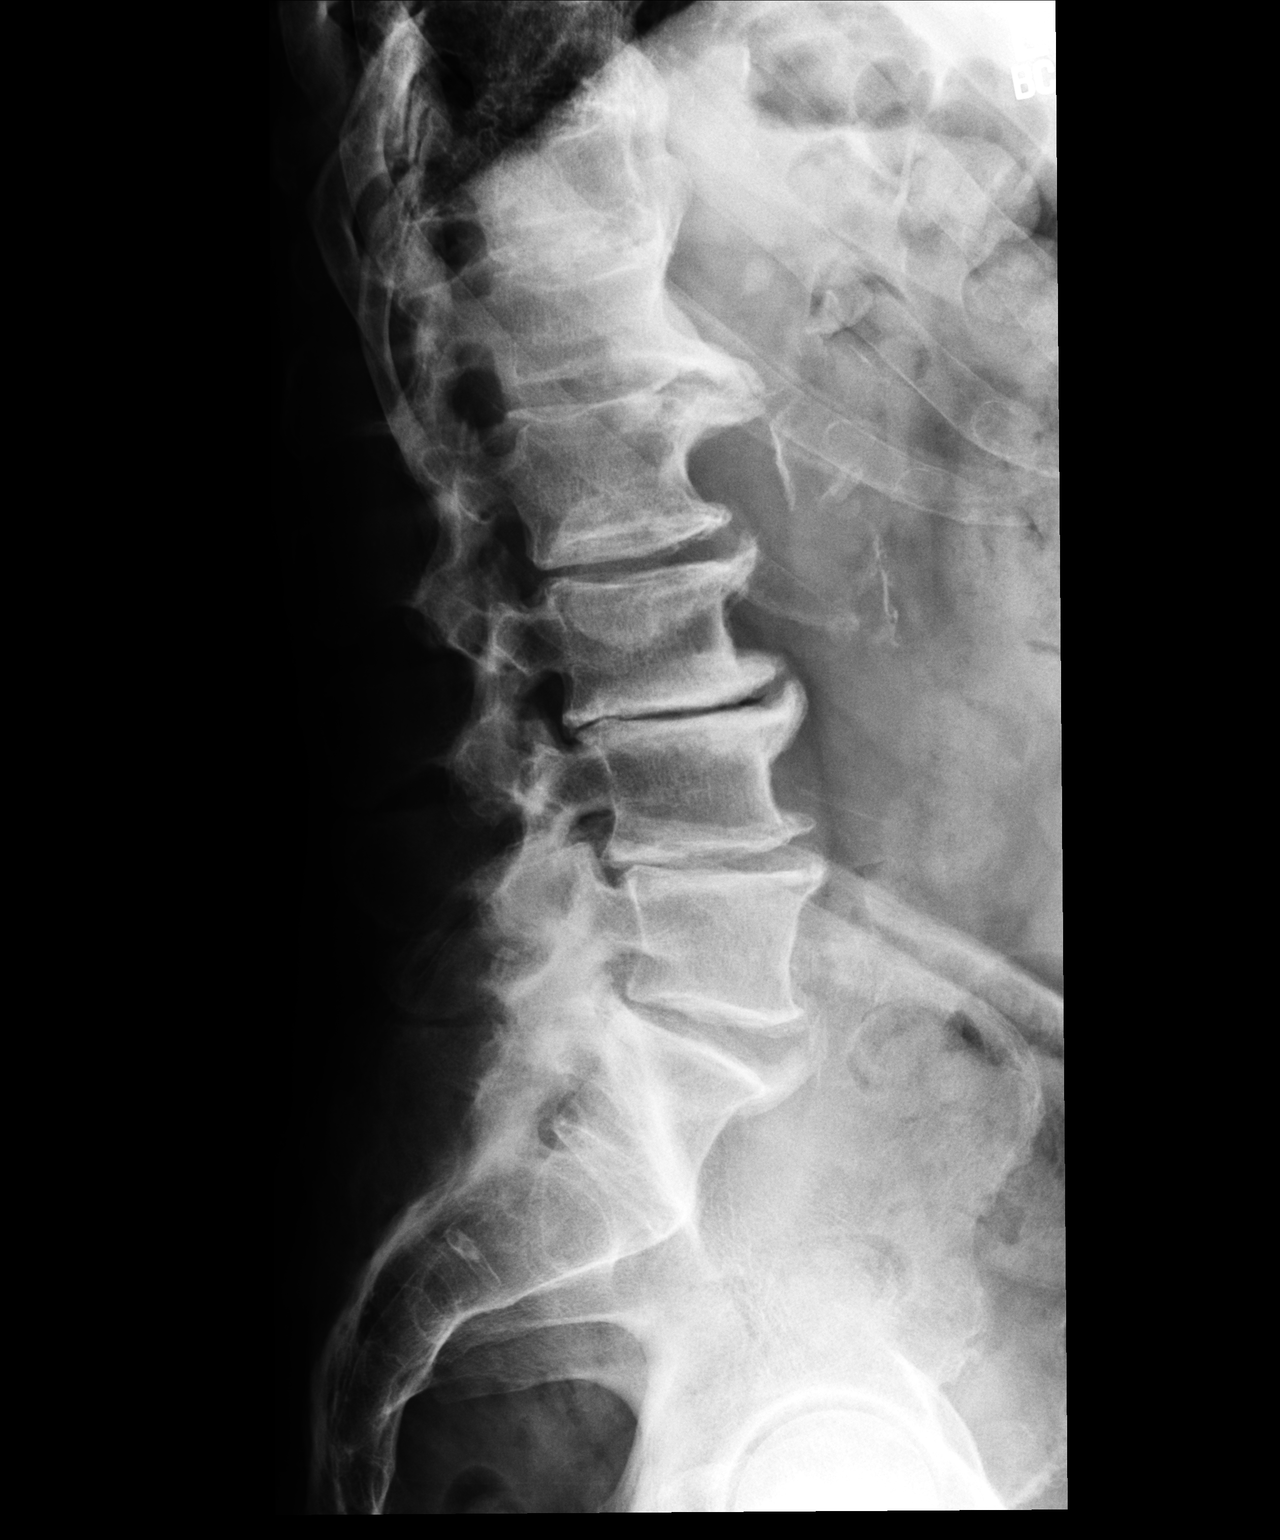

[2 of 2 positions shown; findings below may reference images not displayed]

FINDINGS: There is transitional anatomy present with lumbarization of the S1 vertebral 
body. There appears to be a fully formed disc space separating S1 and S2. Lumbar 
vertebral bodies have been labeled as dictated. Coronal alignment is preserved. 
There is flowing osteophytosis anteriorly and laterally. There are no acute 
compression deformities. There is a 5 mm retrolisthesis of L3 on L4. There is 3 
mm retrolisthesis of L2 on L3 and L1 on L2. No additional site listhesis. There 
is loss of disc space height most pronounced at L2-L3 and L3-L4. Facet 
degenerative changes that are moderate to advanced in severity extending from 
L3-L4 inferiorly.
IMPRESSION: 1.  Please note transitional anatomy present with lumbarization of the S1 
vertebral body. 
2.  Moderate to advanced multilevel lumbar spondylolisthesis as above. This 
appears most pronounced at L2-L3. MRI would better evaluate intrathecal details 
if there is sufficient clinical concern. 
3.  No acute findings.

## 2021-01-19 IMAGING — MR MRI LUMBAR SPINE WITHOUT CONTRAST
4 of 6 series · 23 of 48 positions shown · IV contrast (gadolinium)
Comparison: None.

MRI LUMBAR SPINE WITHOUT CONTRAST, 01/19/2021 [DATE]: 
CLINICAL INDICATION: Lower back pain radiating to the buttocks and legs.
TECHNIQUE: Multiplanar, multiecho position MR images of the lumbar spine were 
performed without intravenous gadolinium enhancement.

[Series 101: survey · axial · 10.0mm · 1.39mm/px · z∈[-15,+199]mm · 3 of 14 slices shown]
[im 1/14]
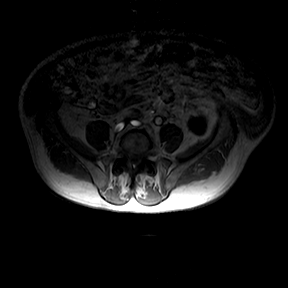
[im 7/14]
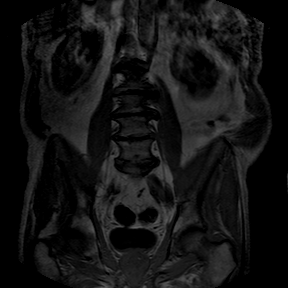
[im 14/14]
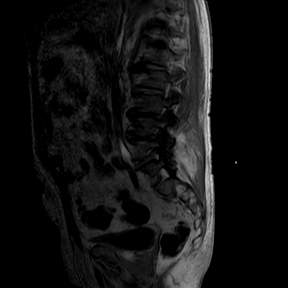

[Series 301: t1w tse sag · sagittal · 4.0mm · 0.49mm/px · 3 of 19 slices shown]
[im 4/19]
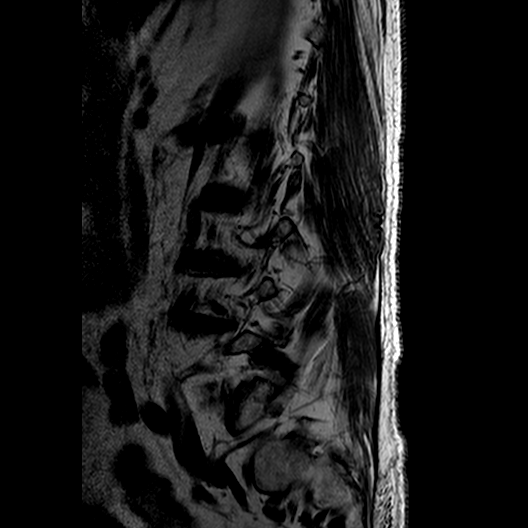
[im 10/19]
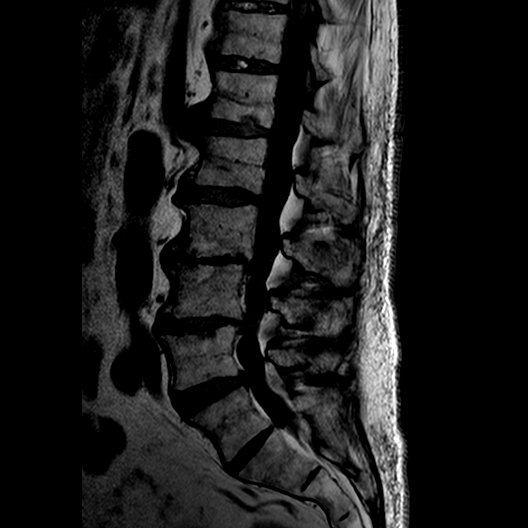
[im 16/19]
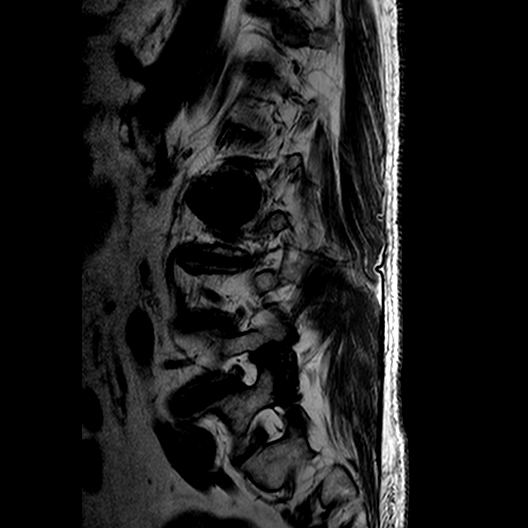

[Series 601: T2 · axial · 4.0mm · 0.47mm/px · z∈[-64,+165]mm · 11 of 30 slices shown]
[im 1/30]
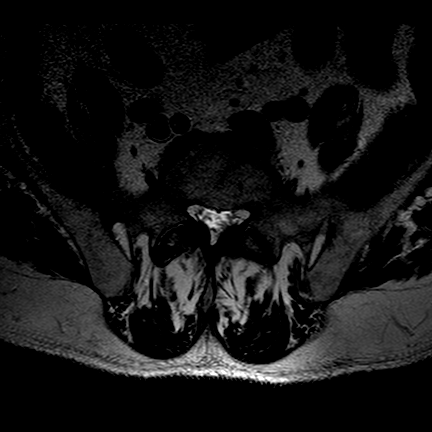
[im 3/30]
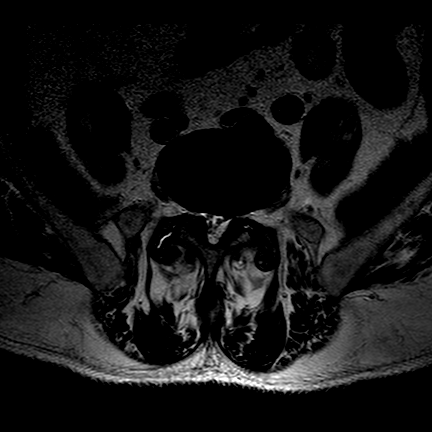
[im 6/30]
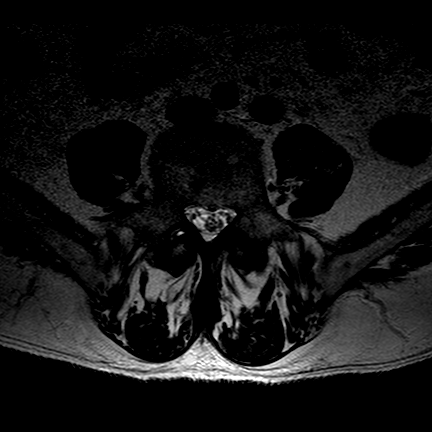
[im 9/30]
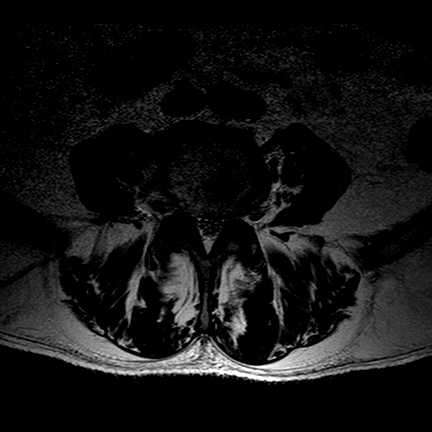
[im 12/30]
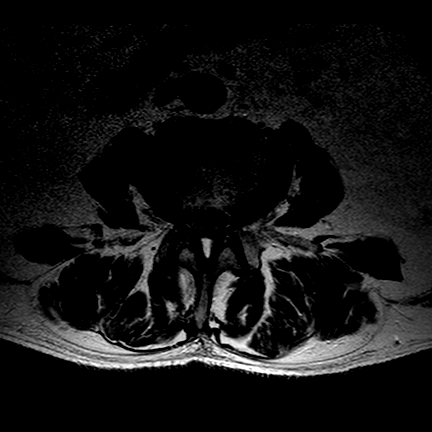
[im 15/30]
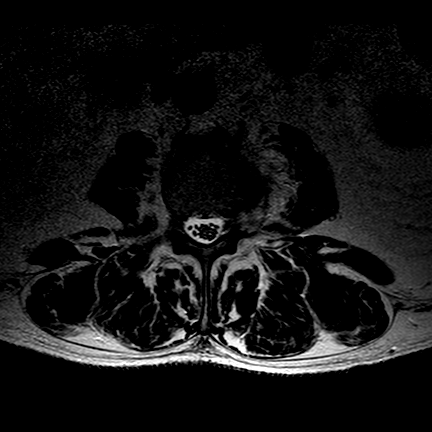
[im 18/30]
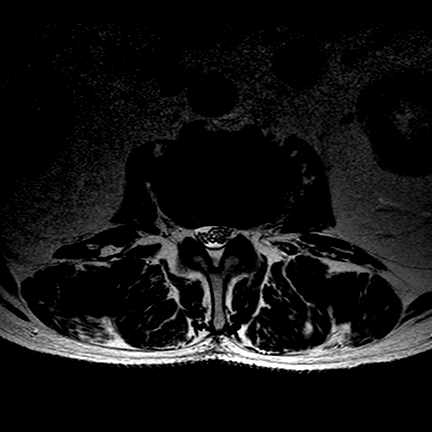
[im 21/30]
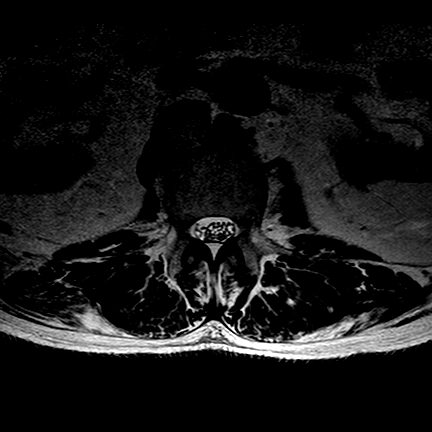
[im 24/30]
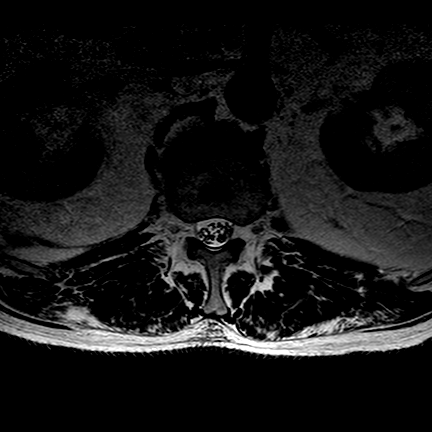
[im 27/30]
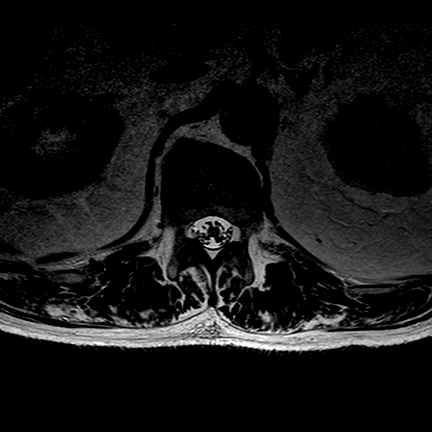
[im 30/30]
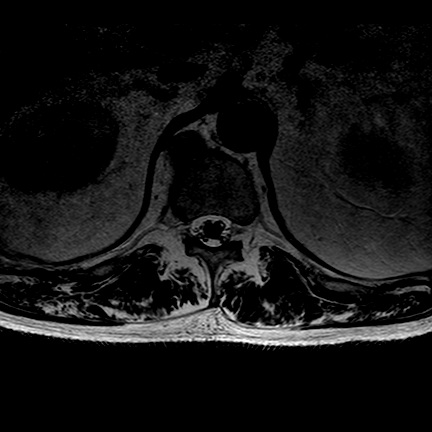

[Series 701: T1 · axial · 4.0mm · 0.35mm/px · z∈[-64,+108]mm · 6 of 35 slices shown]
[im 1/35]
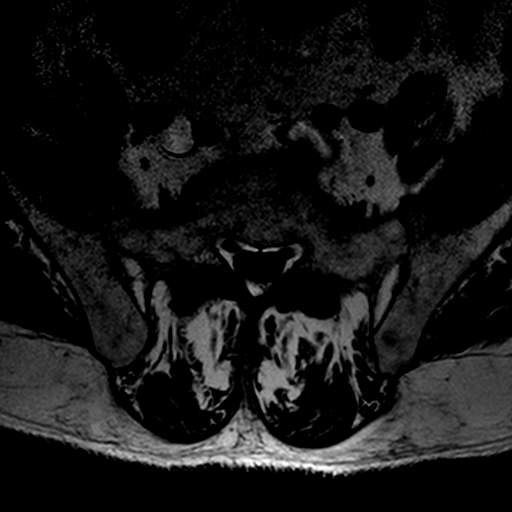
[im 6/35]
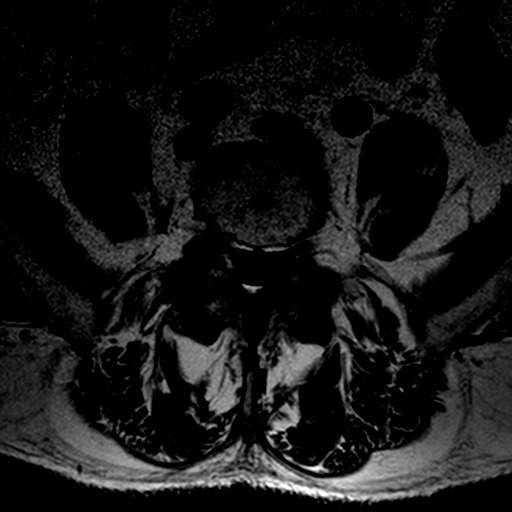
[im 12/35]
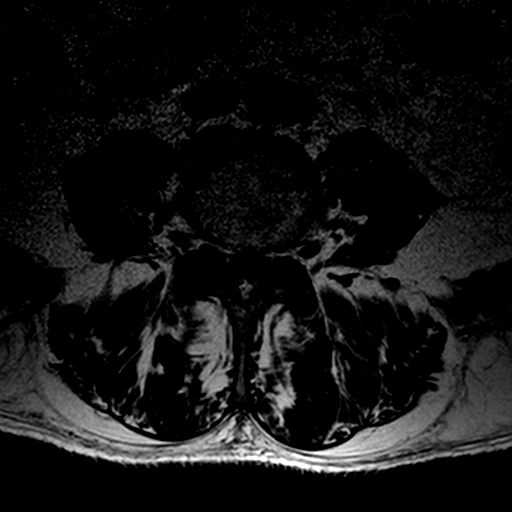
[im 15/35]
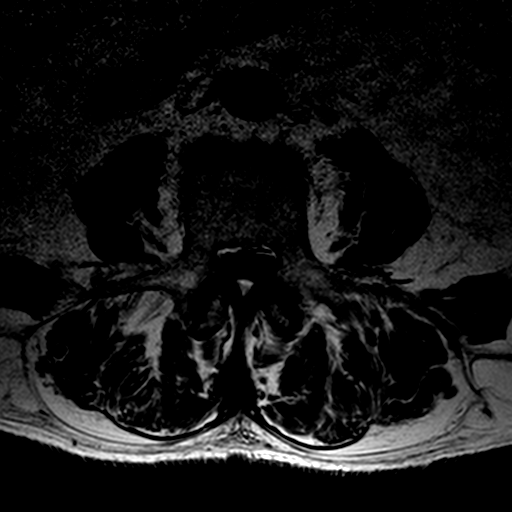
[im 18/35]
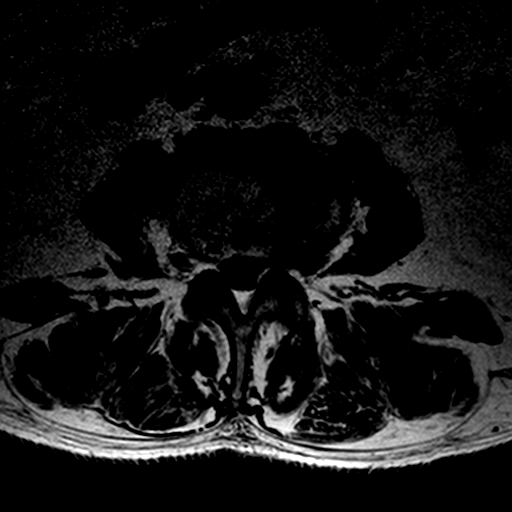
[im 29/35]
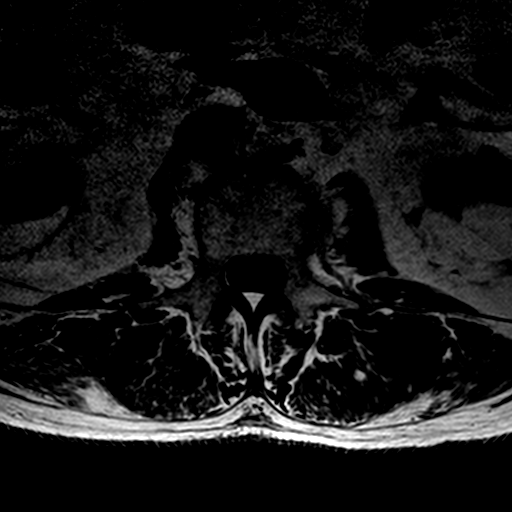

[23 of 48 positions shown; findings below may reference images not displayed]

FINDINGS: Nomenclature is based on 5 lumbar type vertebral bodies. The vertebral 
bodies are normal in height and alignment. No acute vertebral body fracture. No 
spondylolisthesis. No pars defect. The conus tip terminates at the L1 vertebral 
body level. The aorta is normal in diameter. The posterior paraspinal soft 
tissues are negative. 
Modic I-II: L3-4 endplates. 
Ligamentum Flavum > 2.5 mm: None. 
T12-L1: The disc is normal in height and signal. No disc herniation. Mild 
degenerative change within the facet joints. No spinal canal or neural foraminal 
stenosis. 
L1-L2: The disc is normal in height and signal. No disc herniation. Mild 
degenerative change within the facet joints.. No spinal canal or neural 
foraminal stenosis. 
L2-L3: The disc is normal in height and signal. No disc herniation. Moderate 
degenerative change within the facet joints. No spinal canal or neural foraminal 
stenosis. 
L3-L4: Broad-based bulging annulus. No significant focal protrusion of disc 
material. Severe degenerative change within the facet joints with hypertrophy 
ligamentum flavum causing moderate/moderate severe spinal stenosis. Mild 
bilateral foraminal stenosis. 
L4-L5: Broad-based bulging annulus. No significant focal protrusion of disc 
material. Severe degenerative change within the facet joints with hypertrophy 
ligamentum flavum causing moderate/moderate severe spinal stenosis. Mild 
bilateral foraminal stenosis. 
L5-S1: Broad-based bulging annulus. No significant focal protrusion of disc 
material. Severe degenerative change within the facet joints with hypertrophy 
ligamentum flavum causing moderate spinal stenosis and mild bilateral foraminal 
stenosis..
IMPRESSION: No significant focal protrusion of disc material. Broad-based bulging annuli 
L3-4, L4-5 and L5-S1. 
Mild to severe spondylosis with moderate to moderate severe spinal stenosis 
which is greatest at L3-4 and L4-5. 
Modic changes L3-4 endplates. 
No fracture.

## 2021-02-03 IMAGING — CT CT CHEST WITHOUT CONTRAST
2 of 4 series · 15 of 36 positions shown, 18 images · non-contrast
Comparison: None

CT CHEST WITHOUT CONTRAST, 02/03/2021 [DATE]: 
CLINICAL INDICATION:  Hypertension. Myocardial infarction. Small CVA September 2020. On blood thinners. 
A search for DICOM formatted images was conducted for prior CT imaging studies 
completed at a non-affiliated media free facility.
TECHNIQUE: The chest was scanned from base of neck through the lung bases 
without contrast on a high resolution low dose CT scanner. Routine MPR and MIP 
3D renderings were reconstructed on an independent workstation with concurrent 
physician supervision.

[Series 2: chest w/o 2.0 i31s 3 · axial · non-contrast · 0.78mm/px · z∈[-343,-57]mm · 12 of 171 slices shown, 15 images]
[im 14/171  mediastinal]
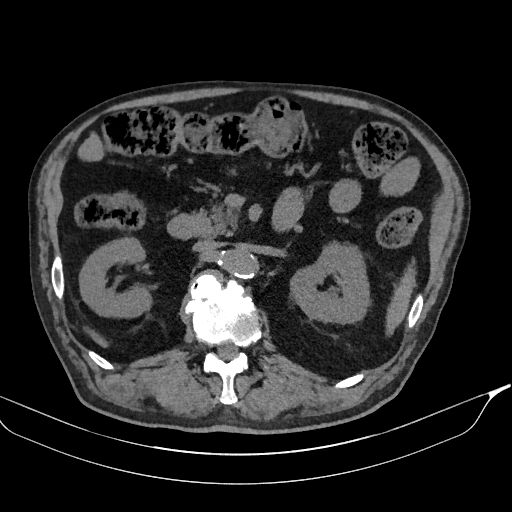
[im 14/171  lung]
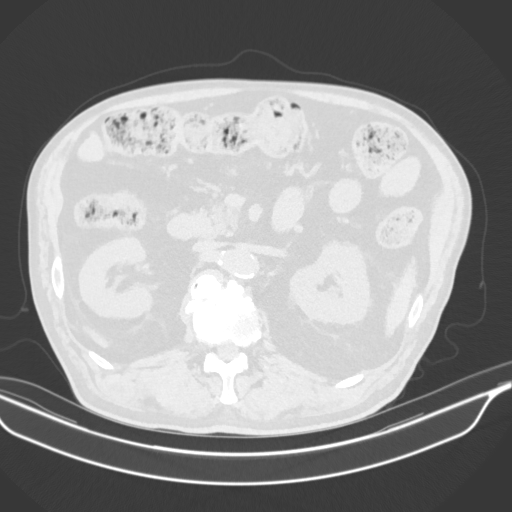
[im 27/171  lung]
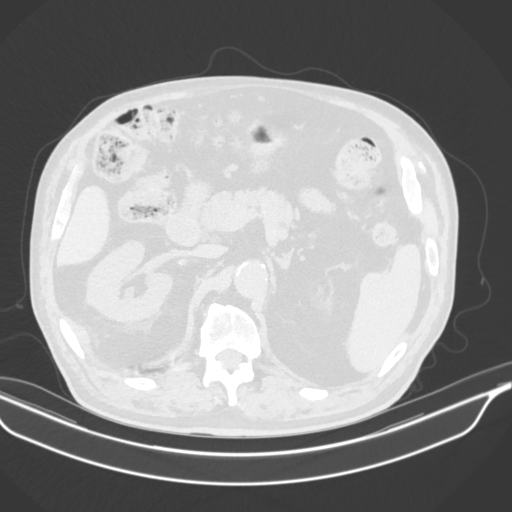
[im 40/171  lung]
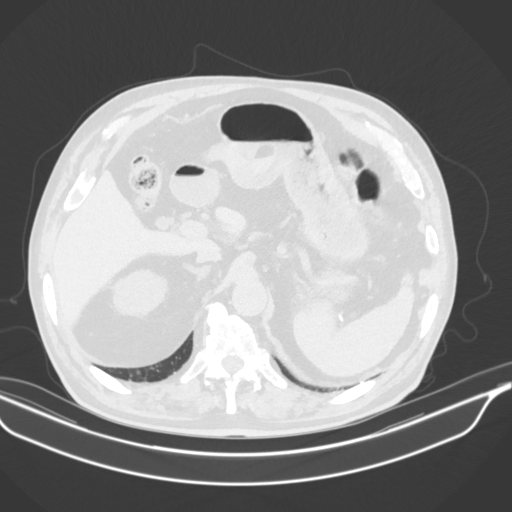
[im 53/171  lung]
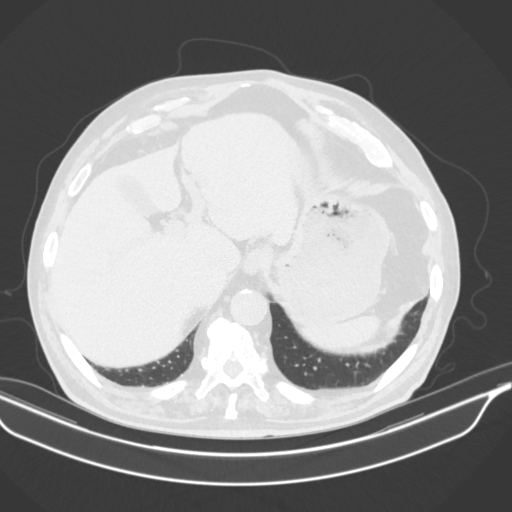
[im 66/171  mediastinal]
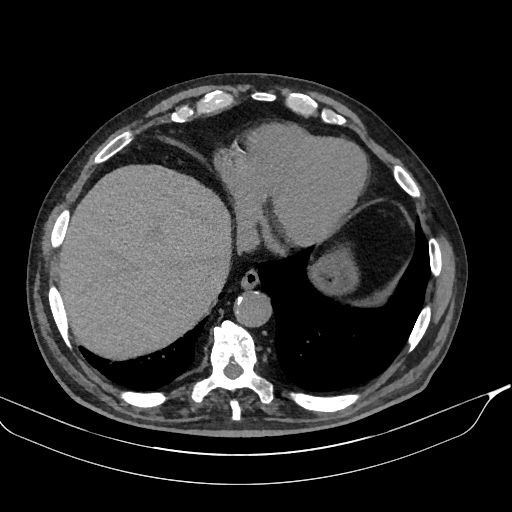
[im 66/171  lung]
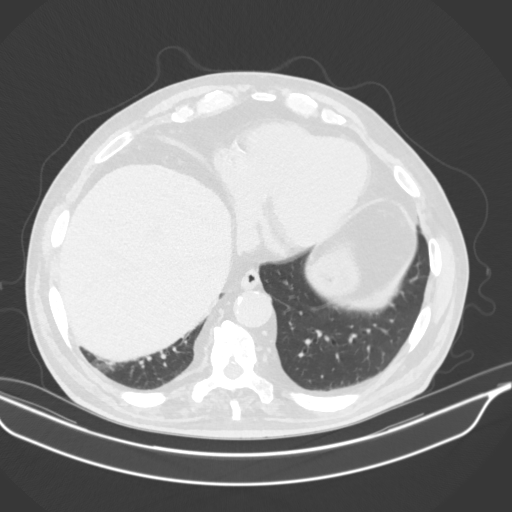
[im 79/171  lung]
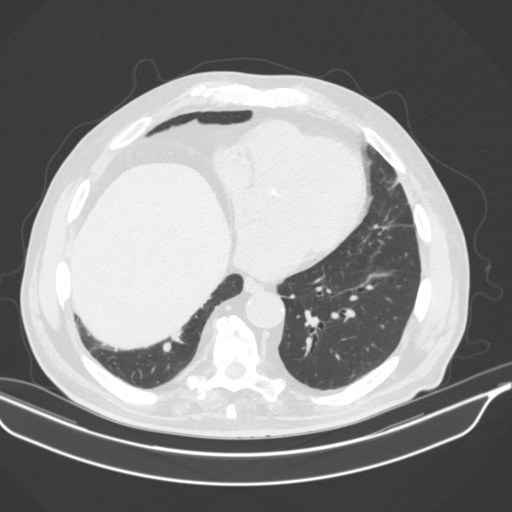
[im 92/171  lung]
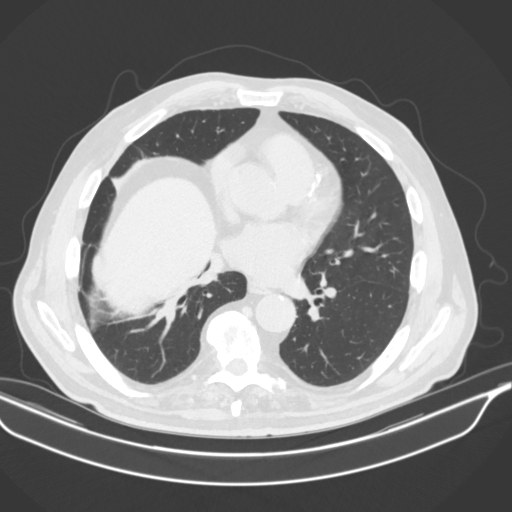
[im 105/171  lung]
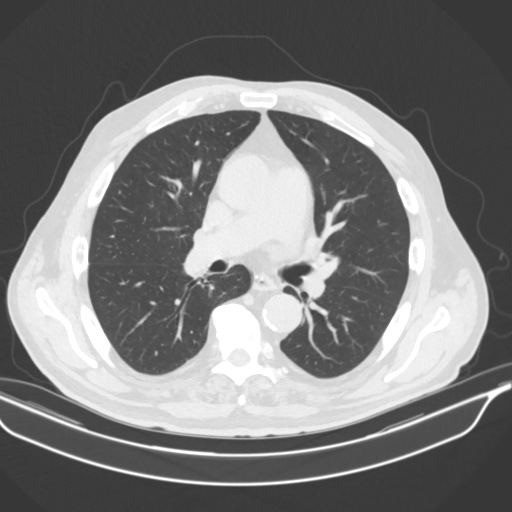
[im 118/171  mediastinal]
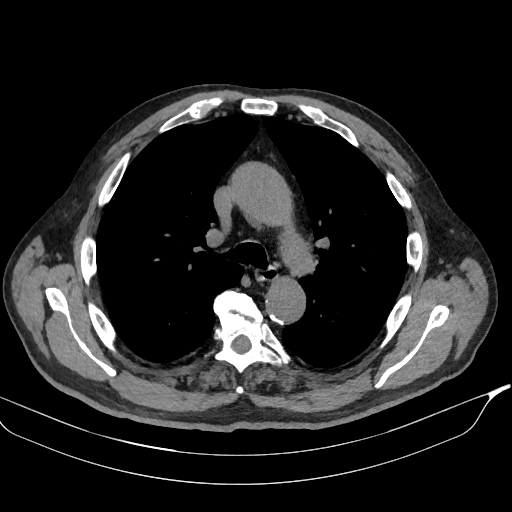
[im 118/171  lung]
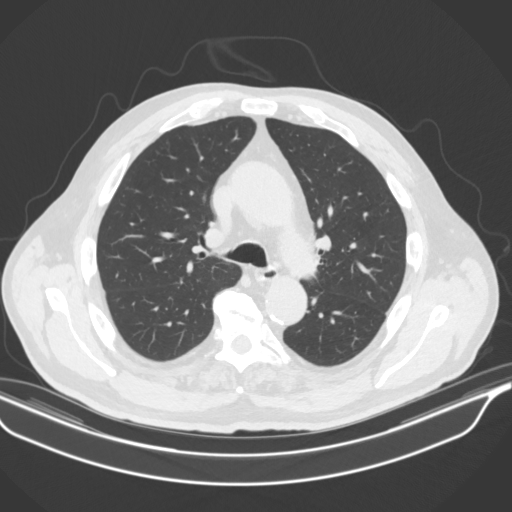
[im 131/171  lung]
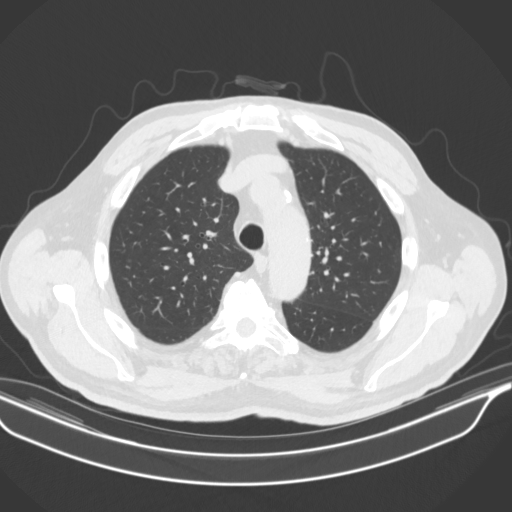
[im 144/171  lung]
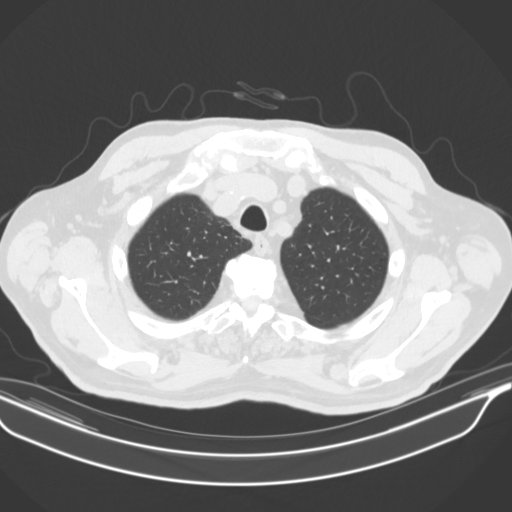
[im 157/171  lung]
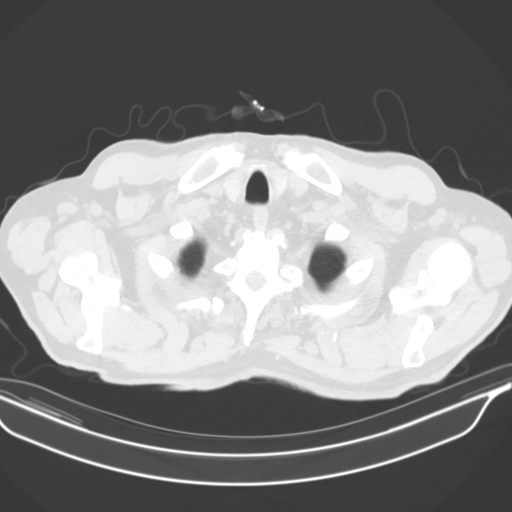

[Series 4: coronal · coronal · 0.68mm/px · 3 of 150 slices shown]
[im 30/150  lung]
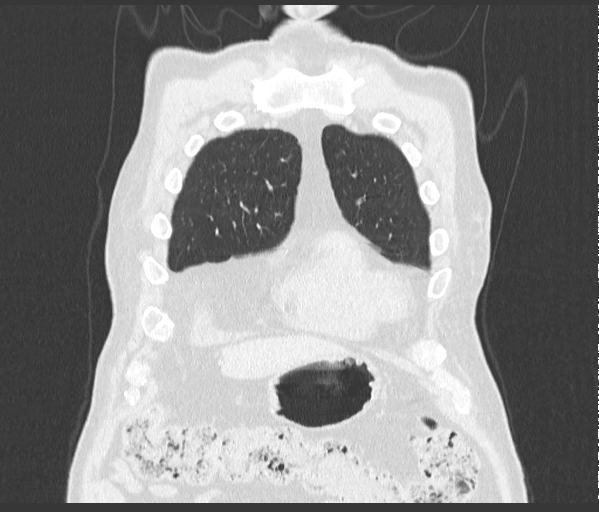
[im 60/150  lung]
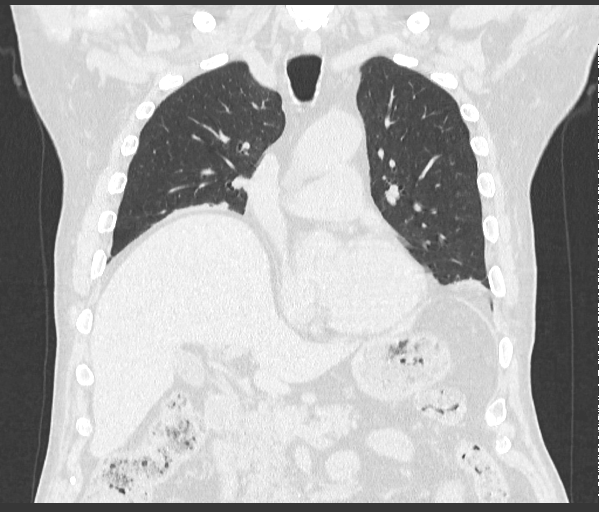
[im 90/150  lung]
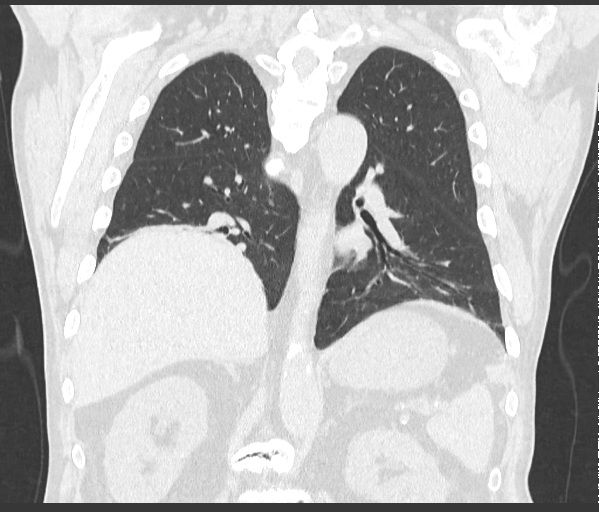

[15 of 36 positions shown; findings below may reference images not displayed]

FINDINGS: LUNGS AND PLEURA:  Mild scarring and/or atelectasis RIGHT lower lobe. Elevated 
RIGHT hemidiaphragm. 
MEDIASTINUM:  No adenopathy. Normal heart size. No pericardial effusion. 
Moderate coronary calcifications. 
CHEST WALL/AXILLA: No mass or adenopathy. 
UPPER ABDOMEN: Negative. 
MUSCULOSKELETAL: No acute abnormality. Extensive degenerative change.
IMPRESSION: Elevated RIGHT hemidiaphragm with atelectasis and/or scarring RIGHT lung base, 
probably chronic. 
Moderate coronary calcifications. 
No acute findings. 
RADIATION DOSE REDUCTION: All CT scans are performed using radiation dose 
reduction techniques, when applicable.  Technical factors are evaluated and 
adjusted to ensure appropriate moderation of exposure.  Automated dose 
management technology is applied to adjust the radiation doses to minimize 
exposure while achieving diagnostic quality images.

## 2021-02-15 NOTE — Telephone Encounter (Signed)
Called patient to see if patient  has any imaging to bring to appointment. Left message on voice mail.

## 2021-02-16 ENCOUNTER — Ambulatory Visit: Admit: 2021-02-16 | Discharge: 2021-02-16 | Payer: MEDICARE | Attending: Neurological Surgery

## 2021-02-16 DIAGNOSIS — M48062 Spinal stenosis, lumbar region with neurogenic claudication: Secondary | ICD-10-CM

## 2021-02-16 NOTE — Telephone Encounter (Signed)
Patient returned call to office and states that he is bringing the imaging with him today to his appointment. All questions answered

## 2021-02-16 NOTE — Progress Notes (Signed)
NEUROSURGERY CONSULT NOTE      Patient Name: Andrew [name]  Patient DOB: 1940-09-28  MRN: [id number]       PCP: Randa Ngo      History of Present Ilness: 80 y.o. presents with back and leg pain starting in October of 2021. He was treated conservatively at [facility] for this and it resolved. He states this started again 3 months ago. Since then, he has had pain in both legs. States it is worse on the right. He has difficult ambulating due to the leg pain and weakness.  States he was diagnosed with a TIA in January of 2022. His wife noticed his gait was unsteady. His symptoms have completely resolved from this episode.   States he was previously taking plavix, but he stopped this medication yesterday.  Chief Complaint   Patient presents with   ??? New Patient     back pain sciatica        Past Medical History:        Diagnosis Date   ??? Cerebral artery occlusion with cerebral infarction [facility])    ??? Heart disease     heart attack 23 years ago   ??? Macular degeneration of left eye    ??? Type 2 diabetes mellitus without complication [facility])        Past Surgical History:        Procedure Laterality Date   ??? COLONOSCOPY  2013   ??? ELBOW SURGERY      right   ??? EYE SURGERY  03/2009    laser surgery right eye   ??? ROTATOR CUFF REPAIR      Right   ??? TONSILLECTOMY     ??? VITRECTOMY  02/2011    right       Home Medications:   Prior to Admission medications    Medication Sig Start Date End Date Taking? Authorizing Provider   atorvastatin (LIPITOR) 80 MG tablet Take 0.5 tablets by mouth daily 12/25/17  Yes Duncan Dull, MD   vitamin B-6 (PYRIDOXINE) 100 MG tablet Take 100 mg by mouth daily   Yes Historical Provider, MD   hydrocortisone 2.5 % cream Apply topically 2 times daily. 07/23/17  Yes Duncan Dull, MD   metFORMIN (GLUCOPHAGE XR) 500 MG extended release tablet Take 1 tablet by mouth 2 times daily 07/23/17  Yes Duncan Dull, MD   Cranberry 180 MG CAPS Take 1 tablet by mouth Takes 4200 mg daily   Yes Historical Provider,  MD   Omega 3 1000 MG CAPS Take 1 tablet by mouth 2 times daily   Yes Historical Provider, MD   ranitidine (ZANTAC) 150 MG tablet Take 150 mg by mouth as needed    Yes Historical Provider, MD   Multiple Vitamins-Minerals (OCUVITE ADULT 50+ PO) Take by mouth 2 times daily    Yes Historical Provider, MD   psyllium (METAMUCIL) 0.52 G capsule Take 0.52 g by mouth daily   Yes Historical Provider, MD   aspirin 81 MG tablet Take 81 mg by mouth daily   Yes Historical Provider, MD   acetaminophen (ACETAMINOPHEN EXTRA STRENGTH) 500 MG tablet Take 500 mg by mouth every 6 hours as needed for Pain   Yes Historical Provider, MD   CO ENZYME Q-10 PO Take 1 tablet by mouth 2 times daily    Yes Historical Provider, MD   Calcium Carb-Cholecalciferol (CALCIUM + D3) 600-200 MG-UNIT TABS Take by mouth   Yes Historical Provider, MD  tamsulosin (FLOMAX) 0.4 MG capsule Take 1 capsule by mouth daily 07/23/17 10/21/17  Duncan Dull, MD   losartan (COZAAR) 50 MG tablet Take 1 tablet by mouth daily 07/23/17 10/21/17  Duncan Dull, MD   atenolol (TENORMIN) 50 MG tablet Take 1 tablet by mouth daily 07/23/17 10/21/17  Duncan Dull, MD   glimepiride (AMARYL) 4 MG tablet Take 1 tablet by mouth 2 times daily (with meals) 07/23/17 10/21/17  Duncan Dull, MD   hydrochlorothiazide (HYDRODIURIL) 25 MG tablet Take 1 tablet by mouth daily 07/23/17 10/21/17  Duncan Dull, MD   sildenafil (VIAGRA) 100 MG tablet Take 1 tablet by mouth as needed for Erectile Dysfunction 07/23/17 10/21/17  Duncan Dull, MD   SITagliptin (JANUVIA) 100 MG tablet Take 1 tablet by mouth daily 07/23/17 10/21/17  Duncan Dull, MD   ONE TOUCH ULTRASOFT LANCETS MISC 1 each by Does not apply route 2 times daily 07/23/17 10/21/17  Duncan Dull, MD   Naproxen Sodium (ALEVE) 220 MG CAPS Take 1 tablet by mouth as needed   Patient not taking: Reported on 02/16/2021    Historical Provider, MD   Probiotic Product (PROBIOTIC & ACIDOPHILUS EX ST PO) Take by  mouth  Patient not taking: Reported on 02/16/2021    Historical Provider, MD           Allergies: Pollen extract and Seasonal    Social History:      TOBACCO:   reports that he has never smoked. He has never used smokeless tobacco.  ETOH:   reports current alcohol use.  RECREATIONAL DRUG USE:   Social History     Substance and Sexual Activity   Drug Use Never       Family History:           Problem Relation Age of Onset   ??? Coronary Art Dis Mother    ??? Diabetes Mother         Type 2   ??? Cancer Mother         Breast   ??? Other Father         CHF             Review of Systems   Constitutional: Negative for chills and fever.   HENT: Negative for congestion, rhinorrhea and sore throat.    Eyes: Negative for photophobia and visual disturbance.   Respiratory: Negative for cough and shortness of breath.    Cardiovascular: Negative for chest pain and palpitations.   Gastrointestinal: Negative for abdominal pain, nausea and vomiting.   Genitourinary: Negative for decreased urine volume and difficulty urinating.   Musculoskeletal: Positive for back pain and gait problem. Negative for neck pain.   Skin: Negative for rash and wound.   Neurological: Positive for weakness and numbness. Negative for seizures, speech difficulty and headaches.   Psychiatric/Behavioral: Negative for behavioral problems and confusion.         Physical Examination:    Vitals:    02/16/21 1018   BP: (!) 168/76   Pulse: 57   Resp: 20   Temp: 97.1 ??F (36.2 ??C)       Physical Exam  Constitutional:       Appearance: Normal appearance.   HENT:      Head: Normocephalic and atraumatic.      Nose: Nose normal.      Mouth/Throat:      Mouth: Mucous membranes are moist.      Pharynx: Oropharynx  is clear.   Eyes:      Extraocular Movements: Extraocular movements intact.      Conjunctiva/sclera: Conjunctivae normal.   Cardiovascular:      Rate and Rhythm: Normal rate and regular rhythm.      Pulses: Normal pulses.   Pulmonary:      Effort: Pulmonary effort is normal.  No respiratory distress.   Abdominal:      General: There is no distension.      Palpations: Abdomen is soft.      Tenderness: There is no abdominal tenderness.   Musculoskeletal:         General: No tenderness. Normal range of motion.      Cervical back: Normal range of motion and neck supple. No muscular tenderness.   Skin:     General: Skin is warm and dry.   Neurological:      Mental Status: He is alert and oriented to person, place, and time.      Cranial Nerves: No cranial nerve deficit.      Sensory: Sensory deficit present.      Motor: Weakness present.      Gait: Gait abnormal.      Comments: Unsteady gait  Difficulty with walking on toes and heels  Strength 4-/5 in ankle dorsiflexion and plantarflexion bilat   Psychiatric:         Mood and Affect: Mood normal.         Behavior: Behavior normal.          Neurologic Exam     Mental Status   Oriented to person, place, and time.       Gait  unsteady      Results  Labs:  Last 24hrs  No results found for this or any previous visit (from the past 24 hour(s)).    Radiology Personal review:  Magnetic resonance imaging and x-rays of the lumbar spine were reviewed.  He has severe degenerative lumbar stenosis L3-S1.  He has advanced spondylosis with large bridging osteophytes at multiple vertebral levels    ASSESSMENT / PLAN :     79yo m with symptomatic pathology referable to his lumbar spine. His imaging was reviewed with him, and he is symptomatic to the above noted pathology. Both surgical and conservative treatment options were discussed with him. With respect to surgery, the risks, benefits, and alternatives to an L3-S1 decompression were discussed and understood by the patient. The rehabilitation process and prognosis were discussed as well and all his questions were answered. He would like to proceed with surgery. He would have to stop plavix, fish oil, cranberry, and NSAIDs 7 days pre op and 7 days post op. He can continue his baby aspirin. He would require  medical clearance from his PCP and PAT. he would have some increased surgical risk secondary to age, comorbidities in conjunction with the degree of pathology and size and scope of the procedure      1. Lumbar stenosis with neurogenic claudication        Electronically signed by Judith Blonder, MD on 02/16/2021 at 11:41 AM

## 2021-02-21 NOTE — Telephone Encounter (Signed)
Lumbar 3 through sacral 1 decompression. ??2 hours. ??2 nights.   [phone] x [phone]  He would have to stop plavix, fish oil, cranberry, and NSAIDs 7 days pre op and 7 days post op. He can continue his baby aspirin. He would require medical clearance from his PCP and PAT

## 2021-02-23 NOTE — Telephone Encounter (Signed)
Called and spoke with patient he is going to proceed with surgery. Patient was wondering if the procedure can me done as outpatient. Please advise.

## 2021-02-23 NOTE — Telephone Encounter (Signed)
His surgery cannot be done as an outpatient. He will require a 2 night stay. The hospital does have large private rooms.

## 2021-02-27 NOTE — Telephone Encounter (Signed)
Called and relayed Iris's message.

## 2021-03-17 NOTE — Telephone Encounter (Signed)
Called and spoke with patient his PAT is scheduled for 07/07 at 1 pm. His arrival time is at 9 am at [address] [facility]. stop plavix, fish oil, cranberry, and NSAIDs 7 days pre op and 7 days post op

## 2021-03-31 ENCOUNTER — Inpatient Hospital Stay: Admit: 2021-03-31 | Attending: Neurological Surgery

## 2021-03-31 LAB — CBC
Hematocrit: 47.9 % (ref 40.0–52.0)
Hemoglobin: 16.3 g/dL (ref 13.0–18.0)
MCH: 30.4 pg (ref 26.0–34.0)
MCHC: 34 % (ref 32.0–36.0)
MCV: 89.4 fL (ref 80.0–98.0)
MPV: 9 fL (ref 7.4–12.4)
Platelets: 168 10*3/uL (ref 140–440)
RBC: 5.36 10*6/uL (ref 4.40–5.90)
RDW: 14.1 % (ref 11.5–14.5)
WBC: 10.1 10*3/uL (ref 3.6–10.7)

## 2021-03-31 LAB — BASIC METABOLIC PANEL
Anion Gap: 13 mmol/L (ref 3–13)
BUN: 18 mg/dL — ABNORMAL HIGH (ref 7–17)
CO2: 21 mmol/L — ABNORMAL LOW (ref 22–30)
Calcium: 9.5 mg/dL (ref 8.4–10.4)
Chloride: 104 mmol/L (ref 98–107)
Creatinine: 0.88 mg/dL (ref 0.52–1.25)
EGFR IF NonAfrican American: 80.2 mL/min (ref >60)
Glucose: 113 mg/dL — ABNORMAL HIGH (ref 70–100)
Potassium: 3.9 mmol/L (ref 3.5–5.1)
Sodium: 139 mmol/L (ref 135–145)
eGFR African American: >90 mL/min (ref >60)

## 2021-03-31 NOTE — Other (Unsigned)
Patient Acct Nbr: [id number]   Primary AUTH/CERT:   Primary Insurance Company Name: [employer]  Primary Insurance Plan name: Medicare A  Primary Insurance Group Number:   Primary Insurance Plan Type: [place] A  Primary Insurance Policy Number: [id number]    Secondary AUTH/CERT:   Secondary Insurance Company Name: [employer]  Secondary Insurance Plan name: Medicare B  Secondary Insurance Group Number:   Secondary Insurance Plan Type: Vickii Chafe Insurance Policy Number: [id number]    Tertiary AUTH/CERT:   [employer] Corporation Name: [employer] [facility] Plan name: [facility] Medicare Supplem  [employer] Number: [id number]  [facility] Insurance Plan Type: [employer] Policy Number: [id number]

## 2021-03-31 NOTE — H&P (Signed)
Comprehensive PreSurgical History and Physical      Name: Andrew [name] MRN: [id number] DOB: 11/20/1940    (Age-80 y.o.)     Date of Service: Pt seen/examined on 03/31/2021     Chief Complaint:  Lumbar stenosis with neurogenic claudication     History Of Present Illness:    80 y.o. male who we are asked to see/evaluate by Judith Blonderane J Donich, MD for pre-operative evaluation prior to Procedure Notes:  L3-S1 DECOMPRESSION  GENERAL ANESTHESIA.    Patient presents with c/o back pain for since october. Has tried conservative methods with little relief. C/o numbness, tingling in bilateral legs Denies difficulty with ambulation. Pain is mild. Denies loss of bowel or bladder. Has elected for procedure above.    Patient denies exertional chest pain/shortness of breath. Denies dizziness, syncope, lightheadedness.     Denies fever, chills, weakness or fatigue. Patient denies any recent illness, infections, or wounds. Patient denies abdominal pain, nausea, vomiting, diarrhea, or constipation.     Patient hx positive for carotid stenosis, CVA, HPL, MI, DM. Patient denies hx of CHF,COPD, asthma, OSA, DVT/PE.    Past Medical History:        Diagnosis Date   ??? Arthritis    ??? Cerebral artery occlusion with cerebral infarction (HCC) 10/02/2020    tia   ??? Heart disease     heart attack 23 years ago   ??? Hyperlipidemia    ??? Hypertension    ??? Macular degeneration of left eye    ??? MI (myocardial infarction) (HCC)    ??? Type 2 diabetes mellitus without complication (HCC)     type II NIDDM       Past Surgical History:        Procedure Laterality Date   ??? APPENDECTOMY     ??? CARDIAC CATHETERIZATION      cath   ??? COLONOSCOPY  2013   ??? ELBOW SURGERY Right     right   ??? EYE SURGERY Right 03/2009    laser surgery right eye   ??? EYE SURGERY Bilateral     cataracts   ??? LAPAROSCOPY      excision of cecum   ??? ROTATOR CUFF REPAIR Right     Right   ??? TONSILLECTOMY     ??? VITRECTOMY  02/2011    right       Medications Prior to Admission:    Prior  to Admission medications    Medication Sig Start Date End Date Taking? Authorizing Provider   atorvastatin (LIPITOR) 80 MG tablet Take 0.5 tablets by mouth daily 12/25/17   Duncan DullJohn Michael Surso, MD   vitamin B-6 (PYRIDOXINE) 100 MG tablet Take 100 mg by mouth daily    Historical Provider, MD   hydrocortisone 2.5 % cream Apply topically 2 times daily.  Patient taking differently: Indications: pt uses daily only-last dose 03/31/2021 Apply topically 2 times daily. 07/23/17   Duncan DullJohn Michael Surso, MD   tamsulosin [facility](FLOMAX) 0.4 MG capsule Take 1 capsule by mouth daily 07/23/17 10/21/17  Duncan DullJohn Michael Surso, MD   metFORMIN (GLUCOPHAGE XR) 500 MG extended release tablet Take 1 tablet by mouth 2 times daily  Patient taking differently: Take 500 mg by mouth 2 times daily Indications: pt takes at hs only  07/23/17   Duncan DullJohn Michael Surso, MD   losartan (COZAAR) 50 MG tablet Take 1 tablet by mouth daily  Patient taking differently: Take 50 mg by mouth daily Indications: last dose 03/31/2021  07/23/17 10/21/17  Duncan Dull, MD   atenolol (TENORMIN) 50 MG tablet Take 1 tablet by mouth daily  Patient taking differently: Take 50 mg by mouth daily Indications: pt takes at hs-took last dose 03/30/2021  07/23/17 10/21/17  Duncan Dull, MD   glimepiride (AMARYL) 4 MG tablet Take 1 tablet by mouth 2 times daily (with meals)  Patient taking differently: Take 4 mg by mouth 2 times daily (with meals) Indications: pt takes daily only-last dose 03/31/2021  07/23/17 10/21/17  Duncan Dull, MD   hydrochlorothiazide (HYDRODIURIL) 25 MG tablet Take 1 tablet by mouth daily  Patient taking differently: Take 25 mg by mouth daily Indications: pt does not take  07/23/17 10/21/17  Duncan Dull, MD   sildenafil (VIAGRA) 100 MG tablet Take 1 tablet by mouth as needed for Erectile Dysfunction  Patient taking differently: Take 100 mg by mouth as needed for Erectile Dysfunction Indications: last dose months ago  07/23/17 10/21/17  Duncan Dull, MD    SITagliptin (JANUVIA) 100 MG tablet Take 1 tablet by mouth daily  Patient taking differently: Take 100 mg by mouth daily Indications: pt takes at hs-last dose 03/31/2021  07/23/17 10/21/17  Duncan Dull, MD   ONE TOUCH ULTRASOFT LANCETS MISC 1 each by Does not apply route 2 times daily 07/23/17 10/21/17  Duncan Dull, MD   Cranberry 180 MG CAPS Take 1 tablet by mouth Takes 4200 mg daily    Historical Provider, MD   Omega 3 1000 MG CAPS Take 1 tablet by mouth 2 times daily    Historical Provider, MD   Naproxen Sodium (ALEVE) 220 MG CAPS Take 1 tablet by mouth as needed   Patient not taking: Reported on 02/16/2021    Historical Provider, MD   ranitidine (ZANTAC) 150 MG tablet Take 150 mg by mouth as needed   Patient not taking: Reported on 03/31/2021    Historical Provider, MD   Multiple Vitamins-Minerals (OCUVITE ADULT 50+ PO) Take by mouth 2 times daily     Historical Provider, MD   psyllium (METAMUCIL) 0.52 G capsule Take 0.52 g by mouth daily    Historical Provider, MD   aspirin 81 MG tablet Take 81 mg by mouth daily    Historical Provider, MD   acetaminophen (ACETAMINOPHEN EXTRA STRENGTH) 500 MG tablet Take 500 mg by mouth every 6 hours as needed for Pain    Historical Provider, MD   CO ENZYME Q-10 PO Take 1 tablet by mouth 2 times daily     Historical Provider, MD   Probiotic Product (PROBIOTIC & ACIDOPHILUS EX ST PO) Take by mouth  Patient not taking: Reported on 02/16/2021    Historical Provider, MD   Calcium Carb-Cholecalciferol (CALCIUM + D3) 600-200 MG-UNIT TABS Take by mouth    Historical Provider, MD       ANTICOAGULATION: Yes, baby asa  CHRONIC STEROID USE:  No  CHRONIC NARCOTIC USE: No    Allergies:  Pollen extract and Seasonal    If patient has opioid allergy, is it okay to take Acetaminophen: N/A      Social History:   TOBACCO:   reports that he has never smoked. He has never used smokeless tobacco.  ETOH:   reports current alcohol use of about 3.0 standard drinks of alcohol per week.  Social  History     Substance and Sexual Activity   Drug Use Never        Family History:      Problem  Relation Age of Onset   ??? Coronary Art Dis Mother    ??? Diabetes Mother         Type 2   ??? Cancer Mother         Breast   ??? Other Father         CHF       REVIEW OF SYSTEMS:   Pertinent positives as noted in the HPI. All other systems reviewed and negative.    PHYSICAL EXAM:  Vitals:  BP (!) 161/73    Pulse 57    Temp 97.4 ??F (36.3 ??C) (Temporal)    Resp 16    Ht 5' 9.75" (1.772 m)    Wt 181 lb 12.8 oz (82.5 kg)    SpO2 94%    BMI 26.27 kg/m??   BMI Classification:  Overweight (BMI 25.0-29.9)  General appearance: No apparent distress, appears stated age and cooperative.  HEENT: Normal cephalic, atraumatic without obvious deformity. Pupils equal, round, and reactive to light.  Extra ocular muscles intact. Conjunctivae/corneas clear.  Neck: No jugular venous distention. Trachea midline.  Respiratory:  Normal respiratory effort. Clear to auscultation, bilaterally without Rales/Wheezes/Rhonchi.  Cardiovascular: Regular rate and rhythm with normal S1/S2 without murmurs, rubs or gallops.  Abdomen: Soft, non-tender, non-distended with normal bowel sounds.  Musculoskeletal: No clubbing, cyanosis or edema bilaterally. Full ROM of all extremities.   Skin: Skin color, texture, turgor normal.  No rashes or lesions.  Neurologic:  Neurovascularly intact without any focal sensory/motor deficits. Cranial nerves: II-XII intact, grossly non-focal.  Psychiatric: Alert and oriented, thought content appropriate, normal insight      Labs:   Lab Results   Component Value Date    WBC 10.1 07/23/2017    HGB 14.4 07/23/2017    HCT 42.0 07/23/2017    MCV 90.6 07/23/2017    PLT 186 07/23/2017     Lab Results   Component Value Date    NA 134 (L) 07/23/2017    K 4.4 07/23/2017    CL 95 (L) 07/23/2017    CO2 27 07/23/2017    BUN 18 07/23/2017    CREATININE 0.84 07/23/2017    GLUCOSE 190 (H) 07/23/2017    CALCIUM 9.7 07/23/2017    PROT 6.6 07/23/2017     LABALBU 4.4 07/23/2017    BILITOT 0.8 07/23/2017    ALKPHOS 81 07/23/2017    AST 24 07/23/2017    ALT 41 07/23/2017    LABGLOM >60 07/14/2014    GFRAA >60 07/14/2014    AGRATIO 1.7 07/14/2014    GLOB 2.4 07/14/2014       Lee's Simple Cardiac Risk Index:  High-risk surgery (intraperitoneal, intrathoracic or suprainguinal vascular surgery): no  Coronary artery disease: yes - 1 Point  Congestive heart failure: no  History of cerebrovascular disease: yes - 1 Point  Insulin treatment for diabetes mellitus: no  Preoperative serum creatinine > 2.0mg /dL: no    Total:   Interpretation:  0 Points Class I   1 Point Class II   2 Points Class III   >=3 Points Class IV        OSA Screening (STOP-Bang)  NO NOT COMPLETE IF ALREADY DIAGNOSED WITH OSA  Do you snore loudly (loud enough to be heard through closed doors, or your bed partner elbows you for snoring at night)?no    Do you often feel tired, fatigued, or sleepy during the daytime (such as falling asleep during driving)?no    Has anyone observed you stop breathing  or choking/gasping during your sleep?no    Do you have or are being treated for high blood pressure? yes - 1 Point    Body mass index more than 35 kg/m2?  no    Age older than 82 years old? yes - 1 Point    Neck size large? (measured around Adam's apple)  For male, is your shirt collar 17 inches or larger?  no  For male, is your shirt collar 16 inches or larger?no    Gender = Male? yes - 1 Point    STOP BANG SCORE = 3 Male over 31 with HTN     Information from PAT significant to anesthetic history as recorded by Sofie Rower, APRN - CNP  on 03/31/2021        Anesthesia Component:    Any problems with anesthesia?: Past General Anesthetic without complications  History of difficult intubation?: None reported  History of difficult IV access?: None reported  Family History of problems with anesthesia?: None noted   Risk Factors PONV:    Male: no  Motion sick or prior PONV: no  NonSmoker: Nonsmoker - (+1)  Opiods  for Procedure: Yes (+1)            Airway and Dentition Patient PAT Education - Risk Assesment      ASA Score:ASA 3 - Hx of CVA >21months ago  Mallampati  PAT assesment: 3        TMD: >4cm  Mouth Size: WNL  Neck: WNL  Dentures: None  Partials: None  Overall Dentition: Teeth intact with none loose,      GERD:None      Frailty Risk Assessment: FRAILTY RISK IF >67 years old - Pt is 80 y.o.               No Risk Factors unless listed   Patient received procedure specific education prior to surgery per nursing     Tobacco:Patient not a current smoker    ETOH:  reports current alcohol use of about 3.0 standard drinks of alcohol per week.  Drug:   Social History     Substance and Sexual Activity   Drug Use Never        Functional Capacity: (>4 METS implies low cardiovascular risk from surgery):  Climb a flight of stairs or walk up a hill (5.50 METs)    PAT Pain Score:3     Postop Pain Management Plan (Pain consult ordered?): Pain consult not indicated at this time             EKG:  I have reviewed the EKG if indicated and there is no concern for ischemic changes or significant heart block.      ECHO and YB:OFBP on file   No results found for: LVEF, LVEFMODE    ASSESSMENT/PLAN:  Patient is considered low/intemediate  risk for this intermediate risk procedure/surgery (Procedure Notes:  L3-S1 DECOMPRESSION  GENERAL ANESTHESIA) with no reducible risk factors. Based on the above evaluation, the benefits of the planned procedure likely exceed the risks.  The patient is medically optimized to proceed with the planned procedure without any further cardiopulmonary testing.    1)  Lumbar stenosis with neurogenic claudication   - deferred to surgeon     2)  CAD/MI  -Denies hx of stents.   -MI over 20+ years ago   -follows Dr. Danielle Dess,  cardiologist at [facility]       3)  CVA  -occurred in Jan 2022, small bleed  -  MRI confirms subacute lacunar infarct in the inferior right basal ganglia  -denies residual   -followed by neurology      4)  DM  -  Managed by PCP.   -patient reports compliance to medications  - Diabetic medications per PAT protocol.  -per anesthesia protocol, BS must be below 250 DOS  Lab Results   Component Value Date    LABA1C 7.9 (H) 07/23/2017     Lab Results   Component Value Date    EAG 180 07/23/2017      HLD  -statin therapy   -lifestyle modifications encouraged    Labs Ordered:  YES - PER PAT PROTOCOL  COVID-19 TESTING ORDERED: []   ECG Ordered:  YES - PER PAT PROTOCOL  Sleep Referral Ordered:  NO - NEGATIVE SCREEN PER SLEEP REFERRAL PROTOCOL          Electronically signed by: , APRN - CNP     Date: 03/31/2021 at 3:16 PM               PAT Protocol referenced includes:  1) Anesthesia Lab Protocol Orders  2) Perioperative Cardiovascular Risk Assessment  3) Anesthesia Assessment   4) Pain Assessment and Acute Pain Service Consult (if appropriate)  5) Medical Clearance/Consult from Internal Medicine (IMS)  6) Shower/Wash Order (for designated surgeries)  7) OSA Screen and Sleep Clinic Referral (if appropriate)

## 2021-03-31 NOTE — Discharge Instructions (Signed)
TAKE the following medications the morning of your surgery-atenolol.    Do NOT take the following medications on the morning of surgery-metformin, amaryl, januvia, losartan, aspirin (stop aspirin 5 days prior to surgery), vitamins.    You may take your prescription pain medication. You may take Tylenol for pain.    NO Motrin, ibuprofen or Advil for 24 hours prior to surgery or longer if instructed by your surgeon.    NO Aleve or Naprosyn for 3 days prior to surgery or longer if instructed by your surgeon.    IF YOU TAKE BLOOD THINNERS OR ASPIRIN: no aspirin for 5 days prior to surgery.    Follow any instructions given to you by Dr. Gatha Mayer.    Shower with an antibacterial soap such as Dial or Safeguard or shower kit provided to you before coming to the hospital.    No makeup, lotion, powder, deodorant or body spays. No hair products. Remove all jewelry and leave it at home. Wear loose comfortable clothing to go home in. You may brush your teeth morning of surgery. Do not wear contacts day of surgery.    No marijuana (THC), smoking or alcohol for 24 hours prior to surgery.    Please arrange for a responsible adult to drive you home after your surgery and that there is a responsible adult with you for 24 hours post discharge.    If you have specific questions, please call your surgeon.    You will receive a call the day before your surgery to verify your arrival time and date. You will be asked to arrive at least two hours prior to your scheduled surgery time.     Please bring your [facility] Surgical folder and medication list with you day of surgery.     We encourage you to write down any questions you may have for the surgeon, anesthesiologist, or other members of the surgical team and bring it with you the day of surgery.     Please bring photo ID and insurance information.

## 2021-03-31 NOTE — H&P (Deleted)
The clinician documented this note on the incorrect encounter. This information has been reviewed and the note has been moved to the correct visit. Please contact local medical records if there are any questions.

## 2021-04-05 ENCOUNTER — Inpatient Hospital Stay
Admit: 2021-04-05 | Discharge: 2021-04-07 | Disposition: A | Discharge status: Home Health | Payer: MEDICARE | Attending: Neurological Surgery | Admitting: Neurological Surgery

## 2021-04-05 DIAGNOSIS — M48062 Spinal stenosis, lumbar region with neurogenic claudication: Secondary | ICD-10-CM

## 2021-04-05 LAB — POCT GLUCOSE
POC Glucose: 171 mg/dL — ABNORMAL HIGH (ref 70–100)
POC Glucose: 215 mg/dL — ABNORMAL HIGH (ref 70–100)
POC Glucose: 150 mg/dL — ABNORMAL HIGH (ref 70–100)
POC Glucose: 176 mg/dL — ABNORMAL HIGH (ref 70–100)

## 2021-04-05 MED ORDER — ATORVASTATIN CALCIUM 40 MG PO TABS
40 MG | Freq: Every day | ORAL | Status: DC
Start: 2021-04-05 — End: 2021-04-07
  Administered 2021-04-06 – 2021-04-07 (×2): 40 mg via ORAL

## 2021-04-05 MED ORDER — SODIUM CHLORIDE 0.9 % IV SOLN
0.9 % | INTRAVENOUS | Status: DC | PRN
Start: 2021-04-05 — End: 2021-04-05

## 2021-04-05 MED ORDER — DEXAMETHASONE SOD PHOSPHATE PF 10 MG/ML IJ SOLN
10 | INTRAMUSCULAR | Status: AC
Start: 2021-04-05 — End: 2021-04-05

## 2021-04-05 MED ORDER — ONDANSETRON HCL 4 MG/2ML IJ SOLN
4 MG/2ML | Freq: Four times a day (QID) | INTRAMUSCULAR | Status: DC | PRN
Start: 2021-04-05 — End: 2021-04-07

## 2021-04-05 MED ORDER — NORMAL SALINE FLUSH 0.9 % IV SOLN
0.9 % | INTRAVENOUS | Status: DC | PRN
Start: 2021-04-05 — End: 2021-04-05

## 2021-04-05 MED ORDER — SODIUM CHLORIDE 0.9 % IV SOLN
0.9 | INTRAVENOUS | Status: DC | PRN
Start: 2021-04-05 — End: 2021-04-07

## 2021-04-05 MED ORDER — MEPERIDINE HCL 25 MG/ML IJ SOLN
25 MG/ML | INTRAMUSCULAR | Status: DC | PRN
Start: 2021-04-05 — End: 2021-04-05

## 2021-04-05 MED ORDER — SITAGLIPTIN PHOSPHATE 100 MG PO TABS
100 MG | Freq: Every day | ORAL | Status: DC
Start: 2021-04-05 — End: 2021-04-05

## 2021-04-05 MED ORDER — LIDOCAINE HCL (PF) 1 % IJ SOLN
1 % | Freq: Once | INTRAMUSCULAR | Status: DC | PRN
Start: 2021-04-05 — End: 2021-04-05

## 2021-04-05 MED ORDER — NORMAL SALINE FLUSH 0.9 % IV SOLN
0.9 % | Freq: Two times a day (BID) | INTRAVENOUS | Status: DC
Start: 2021-04-05 — End: 2021-04-05

## 2021-04-05 MED ORDER — OXYCODONE HCL 5 MG PO TABS
5 MG | ORAL | Status: DC | PRN
Start: 2021-04-05 — End: 2021-04-07
  Administered 2021-04-07: 5 mg via ORAL

## 2021-04-05 MED ORDER — VITAMIN B-12 100 MCG PO TABS
100 MCG | Freq: Every day | ORAL | Status: DC
Start: 2021-04-05 — End: 2021-04-07
  Administered 2021-04-06 – 2021-04-07 (×2): 50 ug via ORAL

## 2021-04-05 MED ORDER — DIPHENHYDRAMINE HCL 50 MG/ML IJ SOLN
50 MG/ML | Freq: Once | INTRAMUSCULAR | Status: DC | PRN
Start: 2021-04-05 — End: 2021-04-05

## 2021-04-05 MED ORDER — PSYLLIUM 58.12 % PO PACK
58.12 % | Freq: Every day | ORAL | Status: DC
Start: 2021-04-05 — End: 2021-04-07
  Administered 2021-04-06 – 2021-04-07 (×2): 1 via ORAL

## 2021-04-05 MED ORDER — OCUVITE-LUTEIN PO CAPS
Freq: Two times a day (BID) | ORAL | Status: DC
Start: 2021-04-05 — End: 2021-04-07
  Administered 2021-04-06 – 2021-04-07 (×4): 1 via ORAL

## 2021-04-05 MED ORDER — ROCURONIUM BROMIDE 50 MG/5ML IV SOLN
50 | INTRAVENOUS | Status: AC
Start: 2021-04-05 — End: 2021-04-05

## 2021-04-05 MED ORDER — HYDROMORPHONE HCL 1 MG/ML IJ SOLN
1 MG/ML | INTRAMUSCULAR | Status: DC | PRN
Start: 2021-04-05 — End: 2021-04-05
  Administered 2021-04-05 (×3): 0.5 mg via INTRAVENOUS

## 2021-04-05 MED ORDER — PROPOFOL 200 MG/20ML IV EMUL
200 | INTRAVENOUS | Status: AC
Start: 2021-04-05 — End: 2021-04-05

## 2021-04-05 MED ORDER — KETAMINE HCL 50 MG/5ML IJ SOSY
50 | INTRAMUSCULAR | Status: AC
Start: 2021-04-05 — End: 2021-04-05

## 2021-04-05 MED ORDER — OXYCODONE HCL 5 MG PO TABS
5 MG | ORAL | Status: DC | PRN
Start: 2021-04-05 — End: 2021-04-05

## 2021-04-05 MED ORDER — HYDRALAZINE HCL 20 MG/ML IJ SOLN
20 MG/ML | INTRAMUSCULAR | Status: DC | PRN
Start: 2021-04-05 — End: 2021-04-05

## 2021-04-05 MED ORDER — LIDOCAINE HCL (PF) 2 % IJ SOLN
2 | INTRAMUSCULAR | Status: AC
Start: 2021-04-05 — End: 2021-04-05

## 2021-04-05 MED ORDER — NORMAL SALINE FLUSH 0.9 % IV SOLN
0.9 | INTRAVENOUS | Status: DC | PRN
Start: 2021-04-05 — End: 2021-04-07

## 2021-04-05 MED ORDER — ACETAMINOPHEN 500 MG PO TABS
500 MG | Freq: Once | ORAL | Status: AC
Start: 2021-04-05 — End: 2021-04-05
  Administered 2021-04-05: 14:00:00 1000 mg via ORAL

## 2021-04-05 MED ORDER — LOSARTAN POTASSIUM 50 MG PO TABS
50 MG | Freq: Every day | ORAL | Status: DC
Start: 2021-04-05 — End: 2021-04-07
  Administered 2021-04-06 – 2021-04-07 (×2): 50 mg via ORAL

## 2021-04-05 MED ORDER — POLYETHYLENE GLYCOL 3350 17 G PO PACK
17 g | Freq: Every day | ORAL | Status: DC
Start: 2021-04-05 — End: 2021-04-07

## 2021-04-05 MED ORDER — LACTATED RINGERS IV SOLN
INTRAVENOUS | Status: DC
Start: 2021-04-05 — End: 2021-04-05

## 2021-04-05 MED ORDER — SUGAMMADEX SODIUM 200 MG/2ML IV SOLN
200 | INTRAVENOUS | Status: AC
Start: 2021-04-05 — End: 2021-04-05

## 2021-04-05 MED ORDER — EPHEDRINE SULFATE (PRESSORS) 50 MG/ML IV SOLN
50 | INTRAVENOUS | Status: AC
Start: 2021-04-05 — End: 2021-04-05

## 2021-04-05 MED ORDER — VITAMIN B-6 100 MG PO TABS
100 MG | Freq: Every day | ORAL | Status: DC
Start: 2021-04-05 — End: 2021-04-07
  Administered 2021-04-06 – 2021-04-07 (×2): 100 mg via ORAL

## 2021-04-05 MED ORDER — ONDANSETRON 4 MG PO TBDP
4 MG | Freq: Three times a day (TID) | ORAL | Status: DC | PRN
Start: 2021-04-05 — End: 2021-04-07

## 2021-04-05 MED ORDER — FAMOTIDINE 20 MG PO TABS
20 MG | Freq: Once | ORAL | Status: AC
Start: 2021-04-05 — End: 2021-04-05
  Administered 2021-04-05: 14:00:00 20 mg via ORAL

## 2021-04-05 MED ORDER — ONDANSETRON HCL 4 MG/2ML IJ SOLN
4 | INTRAMUSCULAR | Status: AC
Start: 2021-04-05 — End: 2021-04-05

## 2021-04-05 MED ORDER — ALPRAZOLAM 0.25 MG PO TBDP
0.25 MG | ORAL | Status: DC | PRN
Start: 2021-04-05 — End: 2021-04-05

## 2021-04-05 MED ORDER — ACETAMINOPHEN 325 MG PO TABS
325 MG | ORAL | Status: DC | PRN
Start: 2021-04-05 — End: 2021-04-07

## 2021-04-05 MED ORDER — LACTATED RINGERS IV SOLN
INTRAVENOUS | Status: DC
Start: 2021-04-05 — End: 2021-04-05
  Administered 2021-04-05: 14:00:00 via INTRAVENOUS

## 2021-04-05 MED ORDER — CEFAZOLIN SODIUM-DEXTROSE 2-4 GM/100ML-% IV SOLN
2-4100- GM/100ML-% | INTRAVENOUS | Status: AC
Start: 2021-04-05 — End: 2021-04-05
  Administered 2021-04-05: 17:00:00 2000 mg via INTRAVENOUS

## 2021-04-05 MED ORDER — MORPHINE SULFATE (PF) 4 MG/ML IJ SOLN
4 MG/ML | INTRAMUSCULAR | Status: DC | PRN
Start: 2021-04-05 — End: 2021-04-07
  Administered 2021-04-06: 02:00:00 2 mg via INTRAVENOUS

## 2021-04-05 MED ORDER — LINAGLIPTIN 5 MG PO TABS
5 MG | Freq: Every day | ORAL | Status: DC
Start: 2021-04-05 — End: 2021-04-07
  Administered 2021-04-06 – 2021-04-07 (×2): 5 mg via ORAL

## 2021-04-05 MED ORDER — SENNA-DOCUSATE SODIUM 8.6-50 MG PO TABS
Freq: Two times a day (BID) | ORAL | Status: DC
Start: 2021-04-05 — End: 2021-04-07
  Administered 2021-04-06 – 2021-04-07 (×4): 1 via ORAL

## 2021-04-05 MED ORDER — SODIUM CHLORIDE 0.9 % IV BOLUS
0.9 % | INTRAVENOUS | Status: DC | PRN
Start: 2021-04-05 — End: 2021-04-05

## 2021-04-05 MED ORDER — OXYCODONE HCL 5 MG PO TABS
5 MG | ORAL | Status: DC | PRN
Start: 2021-04-05 — End: 2021-04-07
  Administered 2021-04-05 – 2021-04-07 (×4): 10 mg via ORAL

## 2021-04-05 MED ORDER — LABETALOL HCL 5 MG/ML IV SOLN
5 MG/ML | INTRAVENOUS | Status: DC | PRN
Start: 2021-04-05 — End: 2021-04-05

## 2021-04-05 MED ORDER — METFORMIN HCL ER 500 MG PO TB24
500 MG | Freq: Two times a day (BID) | ORAL | Status: DC
Start: 2021-04-05 — End: 2021-04-07
  Administered 2021-04-06 – 2021-04-07 (×2): 500 mg via ORAL

## 2021-04-05 MED ORDER — CEFAZOLIN SODIUM-DEXTROSE 2-4 GM/100ML-% IV SOLN
2-4 GM/100ML-% | Freq: Three times a day (TID) | INTRAVENOUS | Status: AC
Start: 2021-04-05 — End: 2021-04-06
  Administered 2021-04-06 (×2): 2000 mg via INTRAVENOUS

## 2021-04-05 MED ORDER — NORMAL SALINE FLUSH 0.9 % IV SOLN
0.9 | Freq: Two times a day (BID) | INTRAVENOUS | Status: DC
Start: 2021-04-05 — End: 2021-04-07
  Administered 2021-04-06 – 2021-04-07 (×3): 10 mL via INTRAVENOUS
  Administered 2021-04-07: 13:00:00 5 mL via INTRAVENOUS

## 2021-04-05 MED ORDER — ONDANSETRON HCL 4 MG/2ML IJ SOLN
4 MG/2ML | Freq: Once | INTRAMUSCULAR | Status: DC | PRN
Start: 2021-04-05 — End: 2021-04-05

## 2021-04-05 MED ORDER — INSULIN LISPRO 100 UNIT/ML IJ SOLN
100 UNIT/ML | INTRAMUSCULAR | Status: DC | PRN
Start: 2021-04-05 — End: 2021-04-05
  Administered 2021-04-05: 19:00:00 4 [IU] via SUBCUTANEOUS

## 2021-04-05 MED ORDER — HYDROMORPHONE HCL 1 MG/ML IJ SOLN
1 MG/ML | INTRAMUSCULAR | Status: DC | PRN
Start: 2021-04-05 — End: 2021-04-05

## 2021-04-05 MED ORDER — ATENOLOL 50 MG PO TABS
50 MG | Freq: Every day | ORAL | Status: DC
Start: 2021-04-05 — End: 2021-04-07
  Administered 2021-04-06 – 2021-04-07 (×3): 50 mg via ORAL

## 2021-04-05 MED ORDER — DEXAMETHASONE SODIUM PHOSPHATE 4 MG/ML IJ SOLN
4 MG/ML | Freq: Four times a day (QID) | INTRAMUSCULAR | Status: AC
Start: 2021-04-05 — End: 2021-04-06
  Administered 2021-04-05 – 2021-04-06 (×4): 4 mg via INTRAVENOUS

## 2021-04-05 MED ORDER — CALCIUM CARB-CHOLECALCIFEROL 500-200 MG-UNIT PO TABS
500-200 MG-UNIT | Freq: Every day | ORAL | Status: DC
Start: 2021-04-05 — End: 2021-04-07
  Administered 2021-04-06 – 2021-04-07 (×2): 1 via ORAL

## 2021-04-05 MED FILL — DEXAMETHASONE SODIUM PHOSPHATE 4 MG/ML IJ SOLN: 4 mg/mL | INTRAMUSCULAR | Qty: 1

## 2021-04-05 MED FILL — ATENOLOL 50 MG PO TABS: 50 mg | ORAL | Qty: 1

## 2021-04-05 MED FILL — CEFAZOLIN SODIUM-DEXTROSE 2-4 GM/100ML-% IV SOLN: 2-4 GM/100ML-% | INTRAVENOUS | Qty: 100

## 2021-04-05 MED FILL — XYLOCAINE-MPF 2 % IJ SOLN: 2 % | INTRAMUSCULAR | Qty: 5

## 2021-04-05 MED FILL — V-C FORTE PO CAPS: ORAL | Qty: 1

## 2021-04-05 MED FILL — FAMOTIDINE 20 MG PO TABS: 20 mg | ORAL | Qty: 1

## 2021-04-05 MED FILL — DIPRIVAN 200 MG/20ML IV EMUL: 200 MG/20ML | INTRAVENOUS | Qty: 20

## 2021-04-05 MED FILL — LOSARTAN POTASSIUM 50 MG PO TABS: 50 mg | ORAL | Qty: 1

## 2021-04-05 MED FILL — BRIDION 200 MG/2ML IV SOLN: 200 MG/2ML | INTRAVENOUS | Qty: 2

## 2021-04-05 MED FILL — DILAUDID 1 MG/ML IJ SOLN: 1 mg/mL | INTRAMUSCULAR | Qty: 0.5

## 2021-04-05 MED FILL — KETAMINE HCL 50 MG/5ML IJ SOSY: 50 MG/5ML | INTRAMUSCULAR | Qty: 5

## 2021-04-05 MED FILL — ONDANSETRON HCL 4 MG/2ML IJ SOLN: 4 MG/2ML | INTRAMUSCULAR | Qty: 2

## 2021-04-05 MED FILL — METFORMIN HCL ER 500 MG PO TB24: 500 mg | ORAL | Qty: 1

## 2021-04-05 MED FILL — VITAMIN B12 100 MCG PO TABS: 100 ug | ORAL | Qty: 1

## 2021-04-05 MED FILL — ALPRAZOLAM 0.25 MG PO TBDP: 0.25 mg | ORAL | Qty: 1

## 2021-04-05 MED FILL — OXYCODONE HCL 5 MG PO TABS: 5 mg | ORAL | Qty: 2

## 2021-04-05 MED FILL — MAPAP 500 MG PO TABS: 500 mg | ORAL | Qty: 2

## 2021-04-05 MED FILL — DEXAMETHASONE SOD PHOSPHATE PF 10 MG/ML IJ SOLN: 10 mg/mL | INTRAMUSCULAR | Qty: 1

## 2021-04-05 MED FILL — TRADJENTA 5 MG PO TABS: 5 mg | ORAL | Qty: 1

## 2021-04-05 MED FILL — HUMALOG 100 UNIT/ML IJ SOLN: 100 [IU]/mL | INTRAMUSCULAR | Qty: 900

## 2021-04-05 MED FILL — ROCURONIUM BROMIDE 50 MG/5ML IV SOLN: 50 MG/5ML | INTRAVENOUS | Qty: 5

## 2021-04-05 MED FILL — EPHEDRINE SULFATE (PRESSORS) 50 MG/ML IV SOLN: 50 mg/mL | INTRAVENOUS | Qty: 1

## 2021-04-05 MED FILL — PEG 3350 17 G PO PACK: 17 g | ORAL | Qty: 1

## 2021-04-05 NOTE — Op Note (Signed)
PATIENTBAYLIN, Andrew [name]  MEDICAL RECORD #:         [id number]  ADMISSION DATE:           04/05/2021  SURGERY DATE:             04/05/2021  ACCOUNT #:                [id number]  DATE OF BIRTH:            [birth year]  AGE:                      79  ADMITTING PHYSICIAN:      Judith Blonder, MD  ATTENDING PHYSICIAN:      Judith Blonder, MD  DICTATING PHYSICIAN:      Judith Blonder, MD                               OPERATIVE RECORD      Procedure:  L3-S1 DECOMPRESSION.    Preoperative Diagnosis:  Lumbar stenosis.    Postoperative Diagnosis:  Lumbar stenosis.    Anesthesia:  General endotracheal anesthesia.    Assistant:  Kara Mead, P.A.  Please note, this institution does  not have neurosurgical residents or other personnel available to  assist.  She assisted throughout the duration of the procedure with  exposure, closure, suction, retraction, irrigation, instrument  cleaning.    Indications:  This is an 80 year old male who presents with clinical  signs and symptoms of the above-stated diagnosis.  Imaging studies  reveal advanced severe degenerative lumbar stenosis and neural  compression L3 through S1.  He does have a transitional segment that  is being referred to as S1.  He is felt to be an adequate candidate  for the procedure and has failed treatment thus far.    Description of Procedure:  After carefully explaining the risks,  benefits, and alternatives of the procedure, informed consent was  obtained.  The patient was taken to the operating suite and placed  under general endotracheal anesthesia.  He was positioned prone on the  [place] table.  Lumbar region was prepped and draped in routine  sterile fashion.  A midline vertical incision was made extending from  the L3 through S1 spinal segments.  Sharp dissection and  electrocautery were used to expose these segments.  Intraoperative  fluoroscopy was used.  A combination of rongeurs, a high-speed drill  and Kerrison punches were then used to  perform a laminectomy at the L3  segment followed by laminectomy at the L4 segment and laminectomy at  the L5 segment and finally a laminectomy at the S1 segment.  At each  level, a central decompression was carried out followed by lateral  recess decompression at each side, which included medial facetectomies  and foraminotomies.  Diskectomies were carried out at L3-4 and L4-L5.  Operative findings included severe degenerative lumbar stenosis L3,  L4, L5, and S1 secondary to combination of factors including facet  hypertrophy, redundant ligamentum flavum, disk herniations with  osteophytic changes at each level.  There was severe constriction of  the thecal sac and associated cauda equina as well as severe  compression of the L3 through S1 nerve roots.  Meticulous hemostasis  was achieved.  An epidural drain was applied and secured at skin exit  point with a suture.  Marcaine was infused to the tissue planes and  the incision was then closed in multiple layers in usual manner.  Sterile dressing was applied.  The patient tolerated the procedure  well without apparent complications and postoperatively was  transferred to the postanesthesia care unit in satisfactory condition.      Diskriter Job ID: [id number]        Judith Blonder, MD    DOD:04/05/2021 06:28 P  DJD/dsk  DOT:04/05/2021 10:31 P  Job Number: [id number] [name]  Document Number: [id number]  cc:   Judith Blonder, MD        [address] [place] [address]

## 2021-04-05 NOTE — Other (Signed)
Update to family via smart call messenger

## 2021-04-05 NOTE — Other (Signed)
Family son and wife at bedside

## 2021-04-05 NOTE — Brief Op Note (Signed)
Brief Postoperative Note      Patient: Andrew [name]  Date of Birth: Feb 07, 1941  MRN: [id number]    Date of Procedure:     Pre-Op Diagnosis: Lumbar stenosis    Post-Op Diagnosis: Same           L3-S1 decompression  Assistant:  Emi Holes pa  Anesthesia: geta    Estimated Blood Loss (mL): less than 100     Complications: None    Specimens:   0  Implants:  0    Drains: 1 epidural    Findings: see op note    Electronically signed by Judith Blonder, MD on 04/05/2021 at 2:11 PM

## 2021-04-05 NOTE — Anesthesia Post-Procedure Evaluation (Signed)
Department of Anesthesiology                             Post Anesthesia Note    Name: Andrew [name] MRN: [id number] DOB: 07-07-1941    (Age-80 y.o.)     This note was authored with review of the notes of the PACU and/or Same day stay and/or direct communication with the patient with an anesthesia care provider.     POST  ANESTHESIA  EVALUATION    Patient Vitals for the past 2 hrs:   BP Temp Pulse Resp SpO2   04/05/21 1730 132/70 97 ??F (36.1 ??C) 65 16 98 %   04/05/21 1715 (!) 120/55 -- 69 15 97 %   04/05/21 1700 136/75 -- 63 19 95 %   04/05/21 1645 133/70 -- 65 19 97 %   04/05/21 1630 133/66 -- 63 16 97 %   04/05/21 1615 123/77 97 ??F (36.1 ??C) 68 18 97 %   04/05/21 1600 135/68 -- 73 16 97 %   04/05/21 1545 137/65 -- 64 20 97 %        BP 132/70    Pulse 65    Temp 97 ??F (36.1 ??C)    Resp 16    Ht 5' 9.75" (1.772 m)    Wt 181 lb (82.1 kg)    SpO2 98%    BMI 26.16 kg/m??      Pain Level: 2    Vital signs: Respiratory and Cardiovascular function within normal limits (See Nursing Record)  Level of consciousness: Patient awake/ Able to participate      Return to baseline mental status: Yes Airway status: Normal   Pain controlled: Yes Dental injury: No   Nausea/Vomiting controlled: Yes Complications: no   Well hydrated: Yes      Patient Experience comments: None expressed    Electronically signed by Willene Hatchet, MD on 04/05/2021 at 5:42 PM

## 2021-04-06 LAB — POCT GLUCOSE
POC Glucose: 247 mg/dL — ABNORMAL HIGH (ref 70–100)
POC Glucose: 233 mg/dL — ABNORMAL HIGH (ref 70–100)
POC Glucose: 291 mg/dL — ABNORMAL HIGH (ref 70–100)
POC Glucose: 258 mg/dL — ABNORMAL HIGH (ref 70–100)

## 2021-04-06 MED ORDER — INSULIN LISPRO 100 UNIT/ML IJ SOLN
100 UNIT/ML | Freq: Every evening | INTRAMUSCULAR | Status: DC
Start: 2021-04-06 — End: 2021-04-07

## 2021-04-06 MED ORDER — DEXTROSE 50 % IV SOLN
50 % | INTRAVENOUS | Status: DC | PRN
Start: 2021-04-06 — End: 2021-04-07

## 2021-04-06 MED ORDER — GLIPIZIDE 10 MG PO TABS
10 MG | Freq: Every day | ORAL | Status: DC
Start: 2021-04-06 — End: 2021-04-06

## 2021-04-06 MED ORDER — GLUCAGON HCL RDNA (DIAGNOSTIC) 1 MG IJ SOLR
1 MG | INTRAMUSCULAR | Status: DC | PRN
Start: 2021-04-06 — End: 2021-04-07

## 2021-04-06 MED ORDER — GLIPIZIDE 10 MG PO TABS
10 MG | Freq: Every day | ORAL | Status: DC
Start: 2021-04-06 — End: 2021-04-07
  Administered 2021-04-06 – 2021-04-07 (×2): 10 mg via ORAL

## 2021-04-06 MED ORDER — DEXTROSE 5 % IV SOLN
5 % | INTRAVENOUS | Status: DC | PRN
Start: 2021-04-06 — End: 2021-04-07

## 2021-04-06 MED ORDER — GLUCOSE 15 GM/32ML PO GEL
15 GM/32ML | ORAL | Status: DC | PRN
Start: 2021-04-06 — End: 2021-04-07

## 2021-04-06 MED ORDER — INSULIN LISPRO 100 UNIT/ML IJ SOLN
100 UNIT/ML | Freq: Three times a day (TID) | INTRAMUSCULAR | Status: DC
Start: 2021-04-06 — End: 2021-04-07
  Administered 2021-04-06 (×2): 6 [IU] via SUBCUTANEOUS
  Administered 2021-04-07: 13:00:00 2 [IU] via SUBCUTANEOUS

## 2021-04-06 MED FILL — ATENOLOL 50 MG PO TABS: 50 mg | ORAL | Qty: 1

## 2021-04-06 MED FILL — PEG 3350 17 G PO PACK: 17 g | ORAL | Qty: 1

## 2021-04-06 MED FILL — DEXAMETHASONE SODIUM PHOSPHATE 4 MG/ML IJ SOLN: 4 mg/mL | INTRAMUSCULAR | Qty: 1

## 2021-04-06 MED FILL — TRADJENTA 5 MG PO TABS: 5 mg | ORAL | Qty: 1

## 2021-04-06 MED FILL — ATORVASTATIN CALCIUM 40 MG PO TABS: 40 mg | ORAL | Qty: 1

## 2021-04-06 MED FILL — V-C FORTE PO CAPS: ORAL | Qty: 1

## 2021-04-06 MED FILL — OXYCODONE HCL 5 MG PO TABS: 5 mg | ORAL | Qty: 2

## 2021-04-06 MED FILL — OXYCODONE HCL 5 MG PO TABS: 5 mg | ORAL | Qty: 1

## 2021-04-06 MED FILL — OYSTER SHELL CALCIUM/D 500-5 MG-MCG PO TABS: 500-5 | ORAL | Qty: 1

## 2021-04-06 MED FILL — GLIPIZIDE 10 MG PO TABS: 10 mg | ORAL | Qty: 1

## 2021-04-06 MED FILL — COLACE 2-IN-1 8.6-50 MG PO TABS: 8.6-50 mg | ORAL | Qty: 1

## 2021-04-06 MED FILL — LOSARTAN POTASSIUM 50 MG PO TABS: 50 mg | ORAL | Qty: 1

## 2021-04-06 MED FILL — VITAMIN B12 100 MCG PO TABS: 100 ug | ORAL | Qty: 1

## 2021-04-06 MED FILL — METFORMIN HCL ER 500 MG PO TB24: 500 mg | ORAL | Qty: 1

## 2021-04-06 MED FILL — CEFAZOLIN SODIUM-DEXTROSE 2-4 GM/100ML-% IV SOLN: 2-4 GM/100ML-% | INTRAVENOUS | Qty: 100

## 2021-04-06 MED FILL — HUMALOG 100 UNIT/ML IJ SOLN: 100 [IU]/mL | INTRAMUSCULAR | Qty: 300

## 2021-04-06 MED FILL — VITAMIN B-6 100 MG PO TABS: 100 mg | ORAL | Qty: 1

## 2021-04-06 MED FILL — METAMUCIL MULTIHEALTH FIBER 58.12 % PO PACK: 58.12 % | ORAL | Qty: 1

## 2021-04-06 MED FILL — MORPHINE SULFATE 4 MG/ML IJ SOLN: 4 mg/mL | INTRAMUSCULAR | Qty: 1

## 2021-04-06 NOTE — Plan of Care (Signed)
Problem: Chronic Conditions and Co-morbidities  Goal: Patient's chronic conditions and co-morbidity symptoms are monitored and maintained or improved  Outcome: Progressing     Problem: Discharge Planning  Goal: Discharge to home or other facility with appropriate resources  Outcome: Progressing     Problem: Pain  Goal: Verbalizes/displays adequate comfort level or baseline comfort level  04/06/2021 2321 by Berdine Addison, RN  Outcome: Progressing  04/06/2021 1141 by Virgilio Frees, RN  Outcome: Progressing  04/06/2021 1138 by Virgilio Frees, RN  Outcome: Progressing     Problem: Safety - Adult  Goal: Free from fall injury  04/06/2021 1138 by Virgilio Frees, RN  Outcome: Progressing     Problem: ABCDS Injury Assessment  Goal: Absence of physical injury  04/06/2021 1138 by Virgilio Frees, RN  Outcome: Progressing

## 2021-04-06 NOTE — Plan of Care (Deleted)
Problem: Pain  Goal: Verbalizes/displays adequate comfort level or baseline comfort level  04/06/2021 1138 by Virgilio Frees, RN  Outcome: Progressing  04/06/2021 0012 by Berdine Addison, RN  Outcome: Progressing  04/06/2021 0012 by Berdine Addison, RN  Outcome: Progressing     Problem: Safety - Adult  Goal: Free from fall injury  Outcome: Progressing

## 2021-04-06 NOTE — IP Home Care (Signed)
Home care liaison following case peripherally for discharge needs.  Electronically signed by Marlou Sa, RN on 04/06/21 at 9:37 AM EDT

## 2021-04-06 NOTE — Plan of Care (Signed)
Problem: Pain  Goal: Verbalizes/displays adequate comfort level or baseline comfort level  04/06/2021 1141 by Virgilio Frees, RN  Outcome: Progressing     Problem: Pain  Goal: Verbalizes/displays adequate comfort level or baseline comfort level  04/06/2021 1138 by Virgilio Frees, RN  Outcome: Progressing

## 2021-04-06 NOTE — Progress Notes (Signed)
Hospitalist Progress Note      Date of Admission: 04/05/2021  Chief Complaint:    No chief complaint on file.    Subjective:  No new complaints.  No nausea, vomiting, chest pain, or headache      Medications:    Infusion Medications   ??? dextrose     ??? sodium chloride       Scheduled Medications   ??? insulin lispro  0-12 Units SubCUTAneous TID WC   ??? insulin lispro  0-6 Units SubCUTAneous Nightly   ??? glipiZIDE  10 mg Oral QAM AC   ??? atenolol  50 mg Oral Daily   ??? atorvastatin  40 mg Oral Daily   ??? calcium-cholecalciferol  1 tablet Oral Daily   ??? losartan  50 mg Oral Daily   ??? metFORMIN  500 mg Oral BID   ??? ocuvite-lutein  1 capsule Oral BID   ??? psyllium  1 packet Oral Daily   ??? vitamin B-12  50 mcg Oral Daily   ??? vitamin B-6  100 mg Oral Daily   ??? sodium chloride flush  5-40 mL IntraVENous 2 times per day   ??? polyethylene glycol  17 g Oral Daily   ??? sennosides-docusate sodium  1 tablet Oral BID   ??? linagliptin  5 mg Oral Daily     PRN Meds: Glucose, dextrose, glucagon (rDNA), dextrose, sodium chloride flush, sodium chloride, ondansetron **OR** ondansetron, acetaminophen, oxyCODONE **OR** oxyCODONE, morphine    Intake/Output Summary (Last 24 hours) at 04/06/2021 2323  Last data filed at 04/06/2021 1742  Gross per 24 hour   Intake --   Output 115 ml   Net -115 ml     Exam:  BP 129/68    Pulse 67    Temp 97.9 ??F (36.6 ??C) (Temporal)    Resp 18    Ht 5' 9.75" (1.772 m)    Wt 181 lb (82.1 kg)    SpO2 96%    BMI 26.16 kg/m??   Head: Normocephalic, atraumatic  Sclera clear  Neck JVD flat  Lungs: normal effort of breathing    Labs:   No results for input(s): WBC, HGB, HCT, PLT in the last 72 hours.  No results for input(s): NA, K, CL, CO2, BUN, CREATININE, CALCIUM, PHOS, AST, ALT, BILIDIR, BILITOT, ALKPHOS in the last 72 hours.    Invalid input(s): MAGNES  No results for input(s): INR in the last 72 hours.  No results for input(s): CKTOTAL, TROPONINI in the last 72 hours.  Radiology:  FL Greater Than 1 Hour    (Results Pending)      Assessment/Plan:    Status post L3 S1 decompression.  This is being primarily managed by surgical spine team.    Diabetes: Metformin, restart sulfonylurea; formulary alternative ordered.  Jardiance is not on formulary, insulin sliding scale, monitor glucose, adjust regimen as needed    35 minutes total care time, >1/2 in unit/floor time and care coordination     Violeta Gelinas, MD ,MD

## 2021-04-06 NOTE — Care Coordination-Inpatient (Signed)
CARE MANAGEMENT INITIAL ASSESSMENT       Discharge Planning  Type of Residence: House  Living Arrangements: Spouse/Significant Other,Family Members  Support Systems: Spouse/Significant Other,Children  Current Services Prior To Admission: None  Potential Assistance Needed: [place]  DME Ordered?: Research scientist (physical sciences) Medications: No  Meds-to-Beds: Does the patient want to have any new prescriptions delivered to bedside prior to discharge?: No  Type of Home Care Services: None  Patient expects to be discharged to:: Rehabilitation facility  History of falls?: No      Emergency Contacts  Contact 1: Name: Adar Rase   (spouse)  Contact 1: Number: [phone]    Social/Functional History  Lives With: Spouse,Son  Type of Home: House  Home Layout: Multi-level  Home Access: Stairs to enter with rails  Entrance Stairs - Number of Steps: 1  Bathroom Shower/Tub: Walk-in shower,Tub/Shower unit  [employer] Equipment: Tax inspector: Accessible  Home Equipment: [employer]  ADL Assistance: Independent  Homemaking Assistance: Independent  Ambulation Assistance: Independent  Transfer Assistance: Independent  Active Driver: Yes  Mode of Transportation: Car  Occupation: Retired  Type of Occupation: Medical Rep    80 yo male admitted to [place] s/p surgery on 04/05/21:  L3-S1 Decompression.  jp drain.  Iv decadron.  PT/OT evals.  Met with pt; explained role of tcc.  Pt lives with spouse and son.  He is independent adls.  He does not use any dme.  He denies any issues paying for rx medications.  PT eval recommending facility based therapy and front-wheeled walker.  dme order for walker obtained. Discussed dc options with pt.  He states if he goes anywhere, he would want a place close to his home in [place].  States he wants to wait until tomorrow for therapy again because he really just wants to go home.  Will follow.  Electronically signed by Consuelo Pandy, RN on 04/06/21 at 11:02 AM EDT

## 2021-04-06 NOTE — Plan of Care (Signed)
Problem: Chronic Conditions and Co-morbidities  Goal: Patient's chronic conditions and co-morbidity symptoms are monitored and maintained or improved  04/06/2021 0012 by Berdine Addison, RN  Outcome: Progressing  04/06/2021 0012 by Berdine Addison, RN  Outcome: Progressing     Problem: Discharge Planning  Goal: Discharge to home or other facility with appropriate resources  04/06/2021 0012 by Berdine Addison, RN  Outcome: Progressing  04/06/2021 0012 by Berdine Addison, RN  Outcome: Progressing  Flowsheets (Taken 04/05/2021 1802 by Verdene Rio, RN)  Discharge to home or other facility with appropriate resources: Identify barriers to discharge with patient and caregiver     Problem: Pain  Goal: Verbalizes/displays adequate comfort level or baseline comfort level  04/06/2021 0012 by Berdine Addison, RN  Outcome: Progressing  04/06/2021 0012 by Berdine Addison, RN  Outcome: Progressing

## 2021-04-06 NOTE — Plan of Care (Signed)
Problem: Chronic Conditions and Co-morbidities  Goal: Patient's chronic conditions and co-morbidity symptoms are monitored and maintained or improved  Outcome: Progressing     Problem: Discharge Planning  Goal: Discharge to home or other facility with appropriate resources  Outcome: Progressing  Flowsheets (Taken 04/05/2021 1802 by Verdene Rio, RN)  Discharge to home or other facility with appropriate resources: Identify barriers to discharge with patient and caregiver     Problem: Pain  Goal: Verbalizes/displays adequate comfort level or baseline comfort level  Outcome: Progressing

## 2021-04-06 NOTE — Progress Notes (Signed)
Occupational Therapy  Facility/Department: [facility] H6 TELEMETRY  Occupational Therapy Initial Assessment    Name: Andrew [name]  DOB: [birth year]  MRN: [id number]  Date of Service: 04/06/2021     **OT wearing mask and gloves throughout entire session**      Discharge Recommendations:  Home with assist PRN  OT Equipment Recommendations  Other: TBD       Patient Diagnosis(es): There were no encounter diagnoses.  Past Medical History:  has a past medical history of Arthritis, Cerebral artery occlusion with cerebral infarction (HCC), Heart disease, Hyperlipidemia, Hypertension, Macular degeneration of left eye, MI (myocardial infarction) (HCC), and Type 2 diabetes mellitus without complication (HCC).  Past Surgical History:  has a past surgical history that includes Eye surgery (Right, 03/2009); Rotator cuff repair (Right); Elbow surgery (Right); Tonsillectomy; vitrectomy (02/2011); Colonoscopy (2013); Cardiac catheterization; eye surgery (Bilateral); Appendectomy; laparoscopy; and back surgery (04/05/2021).           Assessment   Performance deficits / Impairments: Decreased functional mobility ;Decreased ADL status;Decreased safe awareness;Decreased endurance;Decreased balance;Decreased high-level IADLs;Decreased posture  Assessment: OT eval completed. Pt presents with above decicits limiting functional indep. Anticipate pt will progress to home with assist from family and use of AD. Cont oT in hospital to maximize indep and safety with ADL for safe return home.  Prognosis: Good  Decision Making: Low Complexity  Exam: AMPAC  REQUIRES OT FOLLOW-UP: Yes  Activity Tolerance  Activity Tolerance: Patient Tolerated treatment well        Plan   Plan  Times per Week: 5x  Plan Weeks: 4  Current Treatment Recommendations: Strengthening,ROM,Balance training,Functional mobility training,Endurance training,Gait training,Pain management,Safety education & training,Patient/Caregiver education & training,Equipment evaluation, education, &  procurement,Positioning,Self-Care / ADL,Home management training,Cognitive/Perceptual training     Restrictions  Restrictions/Precautions  Restrictions/Precautions: Surgical Protocols,General Precautions  Required Braces or Orthoses?: No  Position Activity Restriction  Spinal Precautions: No Bending,No Twisting,No Lifting  Other position/activity restrictions: JP drain    Subjective   General  Chart Reviewed: Yes  Patient assessed for rehabilitation services?: Yes  Family / Caregiver Present: No  Diagnosis: Pt admitted with lumbar stenosis S/P L3-S1 decompression 04/05/21.  Subjective  Subjective: Pt sitting in recliner, agreeable to OT eval.  General Comment  Comments: R hand dominant, reports 3/10 pain     Social/Functional History  Social/Functional History  Lives With: Spouse,Son  Type of Home: House (split level)  Home Layout: Multi-level,Bed/Bath upstairs (6-7 steps with rail between all the floors)  Home Access: Stairs to enter with rails  Entrance Stairs - Number of Steps: 1  Bathroom Shower/Tub: Walk-in shower,Tub/Shower unit  [employer] Equipment: Psychologist, prison and probation services Accessibility: Accessible  Home Equipment: [employer]  ADL Assistance: Independent  Homemaking Assistance: Independent  Homemaking Responsibilities: Yes  Ambulation Assistance: Independent  Transfer Assistance: Independent  Active Driver: Yes  Mode of Transportation: Car  Occupation: Retired  Type of Occupation: Medical Rep  Additional Comments: Pt reports was very active prior (cycles and walks frequently). Pt reports hx of bilateral sciatica prior to surgery       Objective   Vision: glasses at all time  Hearing [facility]          Observation/Palpation  Posture: Fair (mild rounded shoulders)  Observation: surgical incision intact       Safety Devices  Type of Devices: Left in chair;Call light within reach;Gait belt;Patient at risk for falls;All fall risk precautions in place;Nurse notified  Restraints  Restraints Initially in Place: No  Balance  Sitting: Intact (good)  Standing: Intact (CGA)  Gait  Overall Level of Assistance: Minimum assistance (Initally min assist functional amb to bathroom using FWW then progressed to CGA in room and in hall using FWW.)       Toilet Transfers  Toilet - Technique: Ambulating  Equipment Used: Raised toilet seat with rails  Toilet Transfer: Stand by assistance  Toilet Transfers Comments: cues for safety and technique       AROM: Within functional limits  Strength: Generally decreased, functional  Coordination: Within functional limits  Tone: Normal  Sensation: Intact       ADL  Grooming: Supervision  Grooming Skilled Clinical Factors: Supv to wash hands and brush teeth standing at sink.  LE Dressing: Supervision  LE Dressing Skilled Clinical Factors: Supv to don/doff slipper socks using figure 4 method seated in recliner. Pt did not need AE.  Additional Comments: Estimate min/CGA with LE ADL, bathing and toileting and supv UE ADL and grooming. Treatment initiated. Pt educated in spine precautions and how they affect ADL tasks.       Bed mobility  Bed Mobility Comments: NT- in chair       Transfers  Sit to stand: Contact guard assistance  Stand to sit: Stand by assistance  Transfer Comments: cues for hand placement and safety        Cognition  Overall Cognitive Status: WFL  Cognition Comment: except cues to recall spine precautons. Pt recalled 1/3 from PT session.  Orientation  Overall Orientation Status: Within Functional Limits         Education Given To: Patient  Education Provided: Role of Therapy;Plan of Care;Precautions;ADL Adaptive Strategies;Transfer Training  Education Method: Demonstration;Verbal  Barriers to Learning: None  Education Outcome: Demonstrated understanding;Verbalized understanding        OutComes Score  AM-PAC Daily Activity Inpatient   How much help for putting on and taking off regular lower body clothing?: A Little  How much help for Bathing?: A Little  How much help for Toileting?:  A Little  How much help for putting on and taking off regular upper body clothing?: None  How much help for taking care of personal grooming?: None  How much help for eating meals?: None  AM-PAC Inpatient Daily Activity Raw Score: 21  AM-PAC Inpatient ADL T-Scale Score : 44.27  ADL Inpatient CMS 0-100% Score: 32.79  ADL Inpatient CMS G-Code Modifier : CJ      Goals  Short Term Goals  Time Frame for Short term goals: 4 weeks  Short Term Goal 1: Modif indep LE ADL  Short Term Goal 2: Modif indep grooming task standing at sink  Short Term Goal 3: Modif indep toilet transfer and toileting  Short Term Goal 4: Modif indep item retrieval from cupboard sat various heights  Short Term Goal 5: Pt will recall 3/3 spine precautions and follow consistently without cues       Therapy Time   Individual Concurrent Group Co-treatment   Time In 1132         Time Out 1157         Minutes 25         Timed Code Treatment Minutes: 10 Minutes (ADL)       Goals and/or treatment plan was established in collaboration with patient/family/other representatives.   Patient's Occupational Therapy Plan of Care supervision is transferred to [facility] Occupational Therapist.       Emilie Rutter OTR/L

## 2021-04-06 NOTE — Progress Notes (Signed)
Physical Therapy  Facility/Department: [facility] H6 TELEMETRY  Physical Therapy Initial Assessment    Name: Andrew [name]  DOB: 1941/09/20  MRN: [id number]  Date of Service: 04/06/2021    Discharge Recommendations:   (facility based therapy)   PT Equipment Recommendations  Equipment Needed: Yes  Mobility Devices: Lyda Perone: Rolling      Patient Diagnosis(es): There were no encounter diagnoses.  Past Medical History:  has a past medical history of Arthritis, Cerebral artery occlusion with cerebral infarction (HCC), Heart disease, Hyperlipidemia, Hypertension, Macular degeneration of left eye, MI (myocardial infarction) (HCC), and Type 2 diabetes mellitus without complication (HCC).  Past Surgical History:  has a past surgical history that includes Eye surgery (Right, 03/2009); Rotator cuff repair (Right); Elbow surgery (Right); Tonsillectomy; vitrectomy (02/2011); Colonoscopy (2013); Cardiac catheterization; eye surgery (Bilateral); Appendectomy; laparoscopy; and back surgery (04/05/2021).    Assessment   Body Structures, Functions, Activity Limitations Requiring Skilled Therapeutic Intervention: Decreased functional mobility ;Decreased strength;Decreased balance;Decreased endurance;Decreased ADL status  Assessment: Pt admitted for above deficits. Pt PTA was living with wife and normally independent. Pt is noted throughout session requiring min assist x1 for all mobility. Pt will buckle several times during session and is not safe to go home. Recommend facility based therapy  Therapy Prognosis: Good  Decision Making: Medium Complexity  Requires PT Follow-Up: Yes  Activity Tolerance  Activity Tolerance: Patient limited by pain     Plan   Plan  Plan: 5-7 times per week  Plan weeks: 2 weeks  Current Treatment Recommendations: Strengthening,Balance training,Functional mobility training,Transfer training,Gait training,Stair training,Patient/Caregiver education & training,Safety education & training,Home exercise  program,Therapeutic activities  Safety Devices  Type of Devices: Call light within reach,Left in chair,Gait belt,Nurse notified     Restrictions  Restrictions/Precautions  Required Braces or Orthoses?: No  Position Activity Restriction  Spinal Precautions: No Bending,No Twisting,No Lifting (for comfort)  Other position/activity restrictions: ambulate     Subjective   General  Chart Reviewed: Yes  Patient assessed for rehabilitation services?: Yes  Family / Caregiver Present: No  Diagnosis: L3-S1 decompression on 7/13  Follows Commands: Within Functional Limits  Subjective  Subjective: Pt agreeable for therapy. Pt reports feeling weaker after surgery. Pt reports being very active prior and had no issues with mobility. Pt reports low back pain is 4-5/10         Social/Functional History  Social/Functional History  Lives With: Spouse  Type of Home: House  Home Layout: Multi-level  Home Access: Stairs to enter with rails  Entrance Stairs - Number of Steps: 6 steps with left or right handrail depending on which floor pt goes  Bathroom Shower/Tub: Tub/Shower unit  Home Equipment: [employer]  ADL Assistance: Independent  Homemaking Assistance: Independent  Ambulation Assistance: Independent  Transfer Assistance: Independent  Occupation: Retired  Type of Occupation: Medical Rep  Additional Comments: Pt reports was very active prior (cycles and walks frequently). Pt reports hx of bilateral sciatica prior to surgery  Vision/Hearing  Vision  Vision: Impaired  Vision Exceptions: Wears glasses for reading  Hearing  Hearing: Within functional limits    Cognition   Orientation  Overall Orientation Status: Within Normal Limits  Cognition  Overall Cognitive Status: WFL     Objective   Heart Rate: 68  Heart Rate Source: Monitor  BP: (!) 147/65  BP Location: Right Arm  Patient Position: Sitting  MAP (Calculated): 92.33  Resp: 18  SpO2: 93 %  O2 Device: None (Room air)     Observation/Palpation  Posture: Fair (mild rounded shoulders)  Gross  Assessment  Sensation: Intact     AROM RLE (degrees)  RLE AROM: WFL  AROM LLE (degrees)  LLE AROM : WFL  Strength RLE  Comment: 4-/5  Strength LLE  Comment: 4/5           Bed mobility  Rolling to Left: Minimal assistance  Supine to Sit: Minimal assistance  Sit to Supine: Minimal assistance  Bed Mobility Comments: log roll to left. Required assist to move BLE off edge of bed. Required assist x1 from left sidelying to upright sitting  Transfers  Sit to Stand: Minimal Assistance  Stand to sit: Minimal Assistance  Comment: Pt performed 3 sit to stand transfers. Cued on hand placement. Buckles with standing on 1st attempt  Ambulation  Surface: level tile  Device: No Device  Assistance: Minimal assistance  Quality of Gait: Unsteady, narrow base of support along with excessive unilateral stance time. Difficulty with sequencing with cueing  Gait Deviations: Slow Cadence;Staggers  Distance: 6 feet x1  Comments: unsteady and need FWW  More Ambulation?: Yes  Ambulation 2  Surface - 2: level tile  Device 2: Rolling Walker  Assistance 2: Minimal assistance;Contact guard assistance  Quality of Gait 2: For majority of ambulation, pt was CGA x1. Pt has 2 moments of BLE giving out requiring min assist x1.  Gait Deviations: Decreased step length;Decreased step height  Distance: 50 feet x2  Stairs/Curb  Stairs?: Yes  Stairs  # Steps : 2  Rails: Right ascending  Device: No Device  Assistance: Moderate assistance;2 Person assistance  Comment: Pt required assist from PT and RN (mary kate) to descend/ascend stairs. Cued on sequencing     Balance  Sitting - Static: Good  Sitting - Dynamic: Fair;+  Standing - Static: Fair;+  Standing - Dynamic: Fair  Exercise Treatment: Pt performed exercises 1-8 of spinal level 1 exercises for 10 reps (ankle pumps, ankle circles, gluteal set, quad set, hamstring isometric, heel slide, core contraction with Alt arm raises, seated hamstring stretch). . Pt given handout and educated to perform 3-4x per day         OutComes Score                                                  AM-PAC Score  AM-PAC Inpatient Mobility Raw Score : 11 (04/06/21 1013)  AM-PAC Inpatient T-Scale Score : 33.86 (04/06/21 1013)  Mobility Inpatient CMS 0-100% Score: 72.57 (04/06/21 1013)  Mobility Inpatient CMS G-Code Modifier : CL (04/06/21 1013)          Goals  Short Term Goals  Time Frame for Short term goals: 2 weeks  Short term goal 1: Bed mobility independently via log roll  Short term goal 2: Transfers independently  Short term goal 3: Ambulate 100 feet x1 with least restrictive device with modified independence  Short term goal 4: Ascend/descend 6 steps with handrail with modified independence  Patient Goals   Patient goals : to get home       Education  Patient Education  Education Given To: Patient  Education Provided: Role of Therapy;Plan of Care;Home Exercise Program;Precautions  Education Provided Comments: 1)spinal precautions 2) log roll 3) HEP-3-4x per day  Education Method: Verbal  Barriers to Learning: None  Education Outcome: Verbalized understanding      Therapy Time  Individual Concurrent Group Co-treatment   Time In 0811         Time Out 0851         Minutes 40         Timed Code Treatment Minutes: 25 Minutes (1 unit of TP, 1 unit of GT)     This PT wore PPE as per hospital policy   No Bed/chair alarm on prior to session and no alarm after session  Transfer Plan of care over to [facility] Physical Therapy staff. Goals and/or treatment plan was established in collaboration with pt    Henrene Hawking, PT

## 2021-04-06 NOTE — Progress Notes (Signed)
Subjective:  S/p L3-S1 decompression.  He reports his preop leg pain is better.  His incisional pain and some weakness in the right leg.  Has ambulated with PT, able to void, tolerating p.o. intake.  Drain 30 cc last shift    Physical Exam:  Alert and oriented   Motor strength 5/5 UE and LE except 4/5 right hip flexion  Sensation intact to light touch   Dressing C/D/I JP drain in place    Assessment/Plan:  POD1 s/p L3-S1 decompression.  He is recovering as expected with improvement in preop symptoms.  We will keep drain in today.  He will likely need rehab on discharge pending PT evaluation tomorrow.  Mobilize, pain control, PT.  Likely discharge in the next 1 to 2 days pending PT recommendations and pain control.  Hilbert Odor, PA-C

## 2021-04-07 LAB — POCT GLUCOSE
POC Glucose: 187 mg/dL — ABNORMAL HIGH (ref 70–100)
POC Glucose: 205 mg/dL — ABNORMAL HIGH (ref 70–100)

## 2021-04-07 MED ORDER — ASPIRIN 81 MG PO TABS
81 MG | ORAL_TABLET | Freq: Every day | ORAL | 3 refills | Status: AC
Start: 2021-04-07 — End: ?

## 2021-04-07 MED ORDER — OXYCODONE HCL 5 MG PO TABS
5 MG | ORAL_TABLET | Freq: Four times a day (QID) | ORAL | 0 refills | Status: AC | PRN
Start: 2021-04-07 — End: 2021-04-14

## 2021-04-07 MED FILL — VITAMIN B-6 100 MG PO TABS: 100 mg | ORAL | Qty: 1

## 2021-04-07 MED FILL — LOSARTAN POTASSIUM 50 MG PO TABS: 50 mg | ORAL | Qty: 1

## 2021-04-07 MED FILL — METAMUCIL MULTIHEALTH FIBER 58.12 % PO PACK: 58.12 % | ORAL | Qty: 1

## 2021-04-07 MED FILL — OYSTER SHELL CALCIUM/D 500-5 MG-MCG PO TABS: 500-5 | ORAL | Qty: 1

## 2021-04-07 MED FILL — ATENOLOL 50 MG PO TABS: 50 mg | ORAL | Qty: 1

## 2021-04-07 MED FILL — OXYCODONE HCL 5 MG PO TABS: 5 mg | ORAL | Qty: 2

## 2021-04-07 MED FILL — PEG 3350 17 G PO PACK: 17 g | ORAL | Qty: 1

## 2021-04-07 MED FILL — COLACE 2-IN-1 8.6-50 MG PO TABS: 8.6-50 mg | ORAL | Qty: 1

## 2021-04-07 NOTE — Care Coordination-Inpatient (Unsigned)
Patient Choice     Patient Name:  Andrew [name], Andrew [name]  MRN:  [id number]  Account #:  [id number]  Date of Birth:  [birth year]

## 2021-04-07 NOTE — Progress Notes (Signed)
Subjective:  S/p L3-S1 decompression.  He reports his preop leg pain is better.  His incisional pain and feels his weakness has improved in the right leg.  Has ambulated with PT, able to void, tolerating p.o. intake.  Drain 45 cc last shift    Physical Exam:  Alert and oriented   Motor strength 5/5 UE and LE   Sensation intact to light touch   Dressing C/D/I JP drain in place    Assessment/Plan:  POD2 s/p L3-S1 decompression.  He is recovering as expected with improvement in preop symptoms.  Drain removed without difficulty. Incision is C/D/I with sutures.  Pt states that he is much improved from yesterday and is asking to go home. OT yesterday afternoon stated home with assist and pt does not want to wait for re evaluation by PT. DC instructions discussed and will be discharged today.  Rexanna Louthan Jonita Albee, APRN - CNP

## 2021-04-07 NOTE — Discharge Summary (Signed)
Department of Neurosurgery   Discharge Summary    04/07/2021  9:41 AM     Andrew [name]    28-Aug-1941   [id number]      Date of Admission: 04/05/21    Date of Discharge: 04/07/21    Attending Physician: Wandra Feinstein, MD.    HISTORY OF PRESENT ILLNESS:      The patient is a 80 y.o. male with significant past medical history of lumbar stenosis who presents with bil sciatica and back pain.    HOSPITAL COURSE:    Patient underwent L3-S1 decompression on day of admission. Pt reports improvement in preop symptoms. PT evaluated pt and recommended rehab and OT recommended home with assist. Pt notes that he is improved since seeing PT and is wishes to go home. Discharge instructions discussed and pt was DC'd home in stable condition.    DISCHARGE MEDICATIONS:       Medication List        START taking these medications      oxyCODONE 5 MG immediate release tablet  Commonly known as: ROXICODONE  Take 1 tablet by mouth every 6 hours as needed for Pain for up to 7 days.            CHANGE how you take these medications      aspirin 81 MG tablet  Take 1 tablet by mouth in the morning. May restart 7/20.  What changed: additional instructions     SITagliptin 100 MG tablet  Commonly known as: Januvia  Take 1 tablet by mouth daily  What changed: when to take this            CONTINUE taking these medications      Acetaminophen Extra Strength 500 MG tablet  Generic drug: acetaminophen     atenolol 50 MG tablet  Commonly known as: TENORMIN  Take 1 tablet by mouth daily     atorvastatin 80 MG tablet  Commonly known as: Lipitor  Take 0.5 tablets by mouth daily     Calcium + D3 600-200 MG-UNIT Tabs tablet     CO ENZYME Q-10 PO     Cranberry 180 MG Caps     Garlic 10 MG Caps     glimepiride 4 MG tablet  Commonly known as: AMARYL     Jardiance 25 MG tablet  Generic drug: empagliflozin     losartan 50 MG tablet  Commonly known as: COZAAR  Take 1 tablet by mouth daily     Metamucil 0.52 g capsule  Generic drug: psyllium     OCUVITE ADULT 50+ PO      Omega 3 1000 MG Caps     ONE TOUCH ULTRASOFT LANCETS Misc  1 each by Does not apply route 2 times daily     sildenafil 100 MG tablet  Commonly known as: Viagra  Take 1 tablet by mouth as needed for Erectile Dysfunction     turmeric 500 MG Caps     vitamin B-12 100 MCG tablet  Commonly known as: CYANOCOBALAMIN     vitamin B-6 100 MG tablet  Commonly known as: PYRIDOXINE            ASK your doctor about these medications      metFORMIN 500 MG extended release tablet  Commonly known as: Glucophage XR  Take 1 tablet by mouth 2 times daily               Where to Get Your Medications  These medications were sent to [facility] [place], [place] - [address] - P [phone] - F [phone]  [address], [place] [address]      Phone: [phone]   aspirin 81 MG tablet  oxyCODONE 5 MG immediate release tablet          DISCHARGE DIET:    regular    DISCHARGE ACTIVITY:  Wound care:  Keep incision open to air, do not apply creams or lotions to incision  You may shower, but do not soak in a tub or pool  Call the office immediately if you notice any drainage, pus, or signs of infection    General Instructions:  No lifting greater than 10 pounds for 8 weeks  Patient cannot drive while using opioid pain medications or muscle relaxants  Limit twisting, turning, and bending motions at the waist  Avoid NSAIDs such as Ibuprofen, Advil, Aleve, Naproxen, and Mobic        FOLLOW-UP APPOINTMENTS:  Follow up with Dr. Lewie Loron on 7/28 at 2:15pm  Call the office with any questions or concerns, [phone]

## 2021-04-07 NOTE — Discharge Instructions (Signed)
Wound care:  Keep incision open to air, do not apply creams or lotions to incision  You may shower, but do not soak in a tub or pool  Call the office immediately if you notice any drainage, pus, or signs of infection    General Instructions:  No lifting greater than 10 pounds for 8 weeks  Patient cannot drive while using opioid pain medications or muscle relaxants  Limit twisting, turning, and bending motions at the waist  Avoid NSAIDs such as Ibuprofen, Advil, Aleve, Naproxen, and Mobic    Follow up with Dr. Lewie Loron on 7/28 at 2:15pm  Call the office with any questions or concerns, [phone]

## 2021-04-07 NOTE — Plan of Care (Signed)
Problem: Chronic Conditions and Co-morbidities  Goal: Patient's chronic conditions and co-morbidity symptoms are monitored and maintained or improved  04/07/2021 1145 by Lesle Chris, RN  Outcome: Completed  04/07/2021 0851 by Lesle Chris, RN  Outcome: Progressing  04/06/2021 2321 by Berdine Addison, RN  Outcome: Progressing     Problem: Discharge Planning  Goal: Discharge to home or other facility with appropriate resources  04/07/2021 1145 by Lesle Chris, RN  Outcome: Completed  04/07/2021 0851 by Lesle Chris, RN  Outcome: Progressing  04/06/2021 2321 by Berdine Addison, RN  Outcome: Progressing     Problem: Pain  Goal: Verbalizes/displays adequate comfort level or baseline comfort level  04/07/2021 1145 by Lesle Chris, RN  Outcome: Completed  04/07/2021 0851 by Lesle Chris, RN  Outcome: Progressing  04/06/2021 2321 by Berdine Addison, RN  Outcome: Progressing     Problem: Safety - Adult  Goal: Free from fall injury  Outcome: Completed     Problem: ABCDS Injury Assessment  Goal: Absence of physical injury  Outcome: Completed

## 2021-04-07 NOTE — Progress Notes (Signed)
Hospitalist Progress Note      Date of Admission: 04/05/2021  Chief Complaint:    No chief complaint on file.    Subjective:  No new complaints.  No nausea, vomiting, chest pain, or headache      Medications:    Infusion Medications    dextrose      sodium chloride       Scheduled Medications    insulin lispro  0-12 Units SubCUTAneous TID WC    insulin lispro  0-6 Units SubCUTAneous Nightly    glipiZIDE  10 mg Oral QAM AC    atenolol  50 mg Oral Daily    atorvastatin  40 mg Oral Daily    calcium-cholecalciferol  1 tablet Oral Daily    losartan  50 mg Oral Daily    metFORMIN  500 mg Oral BID    ocuvite-lutein  1 capsule Oral BID    psyllium  1 packet Oral Daily    vitamin B-12  50 mcg Oral Daily    vitamin B-6  100 mg Oral Daily    sodium chloride flush  5-40 mL IntraVENous 2 times per day    polyethylene glycol  17 g Oral Daily    sennosides-docusate sodium  1 tablet Oral BID    linagliptin  5 mg Oral Daily     PRN Meds: Glucose, dextrose, glucagon (rDNA), dextrose, sodium chloride flush, sodium chloride, ondansetron **OR** ondansetron, acetaminophen, oxyCODONE **OR** oxyCODONE, morphine    Intake/Output Summary (Last 24 hours) at 04/07/2021 1235  Last data filed at 04/07/2021 0849  Gross per 24 hour   Intake --   Output 130 ml   Net -130 ml       Exam:  BP (!) 148/72    Pulse 65    Temp 98.8 ??F (37.1 ??C) (Temporal)    Resp 14    Ht 5' 9.75" (1.772 m)    Wt 181 lb (82.1 kg)    SpO2 97%    BMI 26.16 kg/m??   Head: Normocephalic, atraumatic  Sclera clear  Neck JVD flat  Lungs: normal effort of breathing    Labs:   No results for input(s): WBC, HGB, HCT, PLT in the last 72 hours.  No results for input(s): NA, K, CL, CO2, BUN, CREATININE, CALCIUM, PHOS, AST, ALT, BILIDIR, BILITOT, ALKPHOS in the last 72 hours.    Invalid input(s): MAGNES  No results for input(s): INR in the last 72 hours.  No results for input(s): CKTOTAL, TROPONINI in the last 72 hours.  Radiology:  FL Greater Than 1 Hour    (Results Pending)      Assessment/Plan:    Status post L3 S1 decompression.  This is being primarily managed by surgical spine team.  Discharged home today    Diabetes: resume on home medication     Lanetta Inch, MD ,MD

## 2021-04-07 NOTE — Discharge Instructions (Signed)
***********************************************************************  ************************************************************************      Your physician has ordered skilled home care services for you.                   Your home care will be provided by:                       [facility]                                  [phone]      ********************************************************************  **********************************************************************

## 2021-04-07 NOTE — Other (Unsigned)
Patient Acct Nbr: [id number]   Primary AUTH/CERT:   Primary Insurance Company Name: [employer]  Primary Insurance Plan name: Medicare A  Primary Insurance Group Number:   Primary Insurance Plan Type: [place] A  Primary Insurance Policy Number: [id number]    Secondary AUTH/CERT:   Secondary Insurance Company Name: [employer]  Secondary Insurance Plan name: Medicare B  Secondary Insurance Group Number:   Secondary Insurance Plan Type: Vickii Chafe Insurance Policy Number: [id number]    Tertiary AUTH/CERT:   [employer] Name: [employer] [facility] Plan name: [facility] Medicare Supplem  [employer] Group Number: [id number]  [facility] Insurance Plan Type: [employer] Policy Number: [id number]

## 2021-04-07 NOTE — Plan of Care (Signed)
Problem: Chronic Conditions and Co-morbidities  Goal: Patient's chronic conditions and co-morbidity symptoms are monitored and maintained or improved  04/07/2021 0851 by Lesle Chris, RN  Outcome: Progressing     Problem: Discharge Planning  Goal: Discharge to home or other facility with appropriate resources  04/07/2021 0851 by Lesle Chris, RN  Outcome: Progressing     Problem: Pain  Goal: Verbalizes/displays adequate comfort level or baseline comfort level  04/07/2021 0851 by Lesle Chris, RN  Outcome: Progressing

## 2021-04-07 NOTE — Care Coordination-Inpatient (Unsigned)
Admission Date: 04/05/2021 08:45 AM  Patient Name:  Andrew [name], Andrew [name]  MRN:  [id number]  Account #:  [id number]  Location: 1A [id number]  Date of Birth:  1941/03/30  ------------------------------------------------------------------------------  ------------------------------------------------------------------------  Placement Information  ------------------------------------------------------------------------------  ------------------------------------------------------------------------  Referral Type: Home Health Care Services - New                                   Referral ID: [id number]  Provider Name: [facility]  Address 1: [address]                                                       Phone Number: [phone]  Address 2:                                                                       Fax Number: [phone]  City: [facility]                                                                      Selection Factors: Patient/Family Choice  State: [place]

## 2021-04-07 NOTE — IP Home Care (Signed)
Spoke to patient regarding home care therapy services at d/c. Pt is agreeable for home PT to come out to do an evaluation, to see if he needs home PT. Home Care choice offered and referral sent in care port.  Pt did receive the FWW. No other DME needed.   Electronically signed by Marlou Sa, RN on 04/07/21 at 10:30 AM EDT

## 2021-04-07 NOTE — IP Home Care (Signed)
Home Health Referral:  Educated patient on Home Care and services available. Patient offered choice of available HHC agencies and agreeable to PT services with [facility].     Patient Demographics       Name: Andrew [name]    Patient Demographics      [address]  [place] [address]   PCP Randa Ngo     Language/ Communication Barrier  No       Phone [phone] (home)      Cell Phone Telephone Information:   Mobile [phone]             Emergency Contacts  Extended Emergency Contact Information  Primary Emergency Contact: Salmons,Sharon  Address: [address]           [place], [place] [address] [place]  Home Phone: [phone]  Relation: Spouse  Secondary Emergency Contact: Santilli,Duane   [place]  Home Phone: [phone]  Relation: Child              Care Types: Fresh Spine Sx     Pt Home Health goal: home    Is a [facility] Call Needed: YES     Ordering Physician: Judith Blonder, MD    Following Physician: Dr Estell Harpin to Follow: yes  Name/Date/Time of Call: 04/07/21 10:30 AM    Primary Diagnosis & Reason for Services: 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    - If there is wound please remove the dressing 48 hours after surgery.   Respiratory Care:   - Cough and Deep Breath: Use incentive spirometry 10 times every hour while awake for 2 weeks.   Additional Orders:   - Vital signs per home health protocol.     Activity/Weight Bearing:   - Up with assistance: up in chair for all meals, ambulate 3-4 times per day.   - Stretching exercises per PT discharges instructions.   - No Lifting, twisting, pushing, pulling objects, or bending back.      Additional Comments: referral in care port    Social Determinates of Health:   Tobacco Use:  Low Risk     Smoking Tobacco Use: Never    Smokeless Tobacco Use: Never      Social History     Substance and Sexual Activity   Alcohol Use Yes    Alcohol/week: 3.0 standard drinks    Types: 3 Glasses of wine per week      Social History     Substance and Sexual Activity   Drug Use Never        Does the patient have any financial resource strain? no  Does the patient have any food insecurities? no  Does the patient have any housing instabilities? no    If any of the above is noted as yes - consider a MSW evaluation once the patient returns home.     COVID Status   1. Do you have any upper respiratory symptoms (cough, SOB, Fever)? No  2. Have you been exposed to anyone with COVID-19 Virus? No     Internal Administration   First Dose      Second Dose  Last COVID Lab No results found for: SARS-COV-2, SARS-COV-2 RNA, SARS-COV-2, SARS-COV-2, SARS-COV-2 BY PCR, SARS-COV-2, SARS-COV-2, SARS-COV-2          Answer only if pending or positive for COVID-19?  1. Agreeable to wear PPE at each visit? N/A  2. Is the hospital supplying them with PPE upon Discharge? N/A      Discharge Date: 04/07/2021    Referral Source-PACC: (Hospital/Unit): [facility] H6 TELEMETRY   Electronically signed by Marlou Sa, RN on 04/07/2021 at 10:30 AM

## 2021-04-11 ENCOUNTER — Telehealth

## 2021-04-11 MED ORDER — TIZANIDINE HCL 4 MG PO TABS
4 MG | ORAL_TABLET | Freq: Three times a day (TID) | ORAL | 0 refills | Status: AC | PRN
Start: 2021-04-11 — End: ?

## 2021-04-11 NOTE — Telephone Encounter (Addendum)
CAll to patient to follow up from surgery.-Patient states that his pain level has been okay approx 3-4 with medication.States that his incision is Open to air and without any drainage.redness or swelling. Denies any fevers or chills and patient post op appointment was confirmed,Patient wanted to know if he could have a muscle relaxer as he sometimes feels some spasms(tightness ) in his lower back. Patient would like prescription sent to [facility] in [place] To Iris PA-C to review

## 2021-04-11 NOTE — Telephone Encounter (Signed)
I sent a prescription of tizanidine to his pharmacy. He can pick it up.

## 2021-04-12 NOTE — Telephone Encounter (Signed)
D notified of provider comments and picked medication up today at the pharmacy.

## 2021-04-13 ENCOUNTER — Encounter: Attending: Neurological Surgery

## 2021-04-18 NOTE — Telephone Encounter (Signed)
Wife of patient left a message on the Nurse's line about her husband's incision. Jasmine December stated the top of his incision is a little puffy. It is not reddened nor does it have any drainage. Patient is afebrile. He has an appointment on 04-20-2021.       Called patient back and talked to wife. States the incision looks okay, still a little puffy. The pain in his legs, calf area, is much worse. Discussed signs of a clot, and to go to ER if needed. Stated these are not his symptoms at this time. Encouraged to call the office with any other problems.

## 2021-04-20 ENCOUNTER — Ambulatory Visit: Admit: 2021-04-20 | Discharge: 2021-04-20 | Payer: MEDICARE | Attending: Neurological Surgery

## 2021-04-20 DIAGNOSIS — M48062 Spinal stenosis, lumbar region with neurogenic claudication: Secondary | ICD-10-CM

## 2021-04-20 MED ORDER — GABAPENTIN 300 MG PO CAPS
300 MG | ORAL_CAPSULE | Freq: Every evening | ORAL | 0 refills | Status: AC
Start: 2021-04-20 — End: 2021-05-20

## 2021-04-20 NOTE — Progress Notes (Signed)
S/p L3-S1 decompression 2 weeks ago. States his sciatic nerve pain has improved. He has pain in his calves, worse at night. States he continues to have weakness in his right leg. He walks with a walker. He is currently doing home therapy.    PE  Awake and alert  Strength at least 4/5 in BLE, no focal deficits  Incision c/d/I    He appears to be recovering in an expected manner. His sutures were removed. His incision is healing well with no signs of infection. The rehabilitation process was reviewed with him, and all his questions were answered. He was given a prescription of gabapentin to help with his neuropathic pain at night. He will follow up with me in 3 months.

## 2021-04-20 NOTE — Patient Instructions (Signed)
If you have any questions regarding today's visit please call the office at [phone]

## 2021-07-24 ENCOUNTER — Encounter: Attending: Neurological Surgery
